# Patient Record
Sex: Female | Born: 1975 | Race: White | Hispanic: No | Marital: Married | Smoking: Never smoker
Health system: Southern US, Community
[De-identification: ages and names within clinical notes are randomized; demographics above are authoritative.]

## PROBLEM LIST (undated history)

## (undated) HISTORY — PX: ANKLE SURGERY: SHX546

## (undated) HISTORY — PX: BREAST BIOPSY: SHX20

---

## 2006-09-14 ENCOUNTER — Encounter: Payer: Self-pay | Admitting: Obstetrics and Gynecology

## 2006-09-14 ENCOUNTER — Inpatient Hospital Stay (HOSPITAL_COMMUNITY): Admission: AD | Admit: 2006-09-14 | Discharge: 2006-09-17 | Payer: Self-pay | Admitting: Obstetrics and Gynecology

## 2010-07-18 NOTE — H&P (Signed)
[name], [name]                  ACCOUNT NO.:  [id number]   MEDICAL RECORD NO.:  [id number]          PATIENT TYPE:  INP   LOCATION:  9108                          FACILITY:  [facility]   PHYSICIAN:  Tilda Burrow, M.D. DATE OF BIRTH:  15-Jan-1976   DATE OF ADMISSION:  09/14/2006  DATE OF DISCHARGE:                              HISTORY & PHYSICAL   ADMISSION DIAGNOSES:  1. Pregnancy at 41-2/7 weeks.  2. Latent phase of labor.  3. Nonreassuring fetal status.   HISTORY OF PRESENT ILLNESS:  This 35 year old female, G1, P0, AB0, EDC  September 05, 2006, at 41-2/7 weeks' gestation is admitted early on the  afternoon of September 14, 2006, for labor management with cervix 1-2 cm  initially assessed in MAU for several hours and then admitted for labor  management.  She has now progressed to 3-4 cm dilated, but membrane  rupture has revealed meconium discolored amniotic fluid and she is  having intermittent, irregular, severe variable decelerations with some  of the contractions despite the intensity of the contractions being  relatively mild.  Consideration was given to continue labor management  to intentionally increase the labor, but even slight increase in labor  intensity results in nonreassuring variable deceleration, so the patient  will be taken for cesarean section.  Risks and benefits of procedure  have been reviewed.   PAST MEDICAL HISTORY:  Negative.   PAST SURGICAL HISTORY:  Ankle surgery.   ALLERGIES:  LEVAQUIN causes nausea and vomiting only.   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  Husband  is a diabetic.   PRENATAL LABORATORY DATA:  Blood type O positive.  Varicella antibodies  present.  Rubella immune.  Hemoglobin 14, hematocrit 41.  Hepatitis,  HIV, RPR, GC and chlamydia all negative.  HSV-2 negative.  Group B  Streptococcus negative.  Glucose tolerance test 78 mg/percent.   The patient's cervix has not softened hardly at all and she remained  firm at  41 weeks with posterior closed cervix.  She was scheduled for  induction on the evening of July 11, but presented early in the day.   PHYSICAL EXAMINATION:  VITAL SIGNS:  Height 5 feet 8 inches, weight 210.  GENERAL:  Alert, oriented and active female tolerating labor adequately,  although intensity is only mild.  Epidural is in place.  ABDOMEN:  Term size fetus with estimated fetal weight 6-7 pounds with  difficulty distinguishing the upper margin of the pubic ramus for fundal  height measurement.  PELVIC:  Cervix 3-4 cm, 50%, very firm cervical tissue texture, -2  station, no descent of vertex occurring with contractions.   IMPRESSION:  Pregnancy at 41-2/7 weeks with nonreassuring fetal status.   PLAN:  Primary cesarean section.  Risks and benefits were reviewed.      Tilda Burrow, M.D.  Electronically Signed     JVF/MEDQ  D:  09/15/2006  T:  09/16/2006  Job:  [id number]   cc:   [facility]

## 2010-07-18 NOTE — Op Note (Signed)
NAMELURENA, [name]                  ACCOUNT NO.:  [id number]   MEDICAL RECORD NO.:  [id number]          PATIENT TYPE:  INP   LOCATION:  9108                          FACILITY:  [facility]   PHYSICIAN:  Tilda Burrow, M.D. DATE OF BIRTH:  [birth year]   DATE OF PROCEDURE:  09/14/2006  DATE OF DISCHARGE:                               OPERATIVE REPORT   PREOPERATIVE DIAGNOSES:  1. Pregnancy at 41 weeks.  2. Nonreassuring fetal status.  3. Meconium discolored amniotic fluid.   POSTOPERATIVE DIAGNOSES:  1. Pregnancy at 41 weeks.  2. Nonreassuring fetal status.  3. Meconium discolored amniotic fluid.   OPERATION PERFORMED:  Primary low transverse cervical cesarean section.   SURGEON:  Tilda Burrow, M.D.   ASSISTANTMichele Mcalpine D. Okey Dupre, M.D.   ANESTHESIA:  Epidural.   COMPLICATIONS:  None.   FINDINGS:  Thick meconium, discolored amniotic fluid.  Female infant,  Apgars 1, 2, and 9 at one, five and 10 minutes.  Weight 6 pounds, 11  ounces (3040 g).   DESCRIPTION OF PROCEDURE:  The patient was taken to the operating room.  Epidural analgesia confirmed as being adequate and Pfannenstiel type  incision performed.  Bladder flap was developed easily and without  difficulty and transverse uterine incision performed.  Index finger  retraction transversely and upward resulting in good access to the fetal  vertex with the infant delivered in the incision and expelled using  fundal pressure.  Bulb suctioning was performed briefly prior to handing  the baby to Dr. Joana Reamer, who then proceeded with a thorough nasogastric  suctioning and whose notes indicate resuscitation process.  The baby  responded nicely to bag mask ventilation once the thorough suctioning  had been completed.   The patient then had antibiotic intravenously administered and  irrigation of the uterine cavity after the placenta was delivered.  Cord  blood gases were obtained with cord blood pH reported as 7.1.  PCO2 is  documented in the record elsewhere.  Patient then had the uterus closed  in single layer in running locking fashion.  The bladder flap closed  with 2-0 chromic.  The anterior peritoneum closed with 2-0 chromic after  irrigation of the abdomen thoroughly.  The rectus muscles pulled  together with 2-0 chromic as well.  The fascia was closed using 0-0  Vicryl.  The subcutaneous tissues were approximated with 3-0 interrupted  sutures of 2-0 plain, staple closure of skin completed the procedure.  The patient tolerated the procedure well and went to the recovery room  in stable condition.  The estimated blood loss was 500 mL.   Note:  The baby's cord was extremely long, wrapped around the baby's  body and neck x1, and was noted to be extremely tight spiral.  Cord  venous diameter was quite good but there was minimal Wharton's jelly.      Tilda Burrow, M.D.  Electronically Signed     JVF/MEDQ  D:  09/15/2006  T:  09/16/2006  Job:  [id number]

## 2010-07-18 NOTE — Discharge Summary (Signed)
[name], [name]                  ACCOUNT NO.:  [id number]   MEDICAL RECORD NO.:  [id number]          PATIENT TYPE:  INP   LOCATION:  9108                          FACILITY:  [facility]   PHYSICIAN:  Tilda Burrow, M.D. DATE OF BIRTH:  04-Sep-1975   DATE OF ADMISSION:  09/14/2006  DATE OF DISCHARGE:  09/17/2006                               DISCHARGE SUMMARY   ADMITTING DIAGNOSIS:  Pregnancy at 41 weeks 2 days, active labor.   HISTORY OF PRESENT ILLNESS:  This 35 year old female gravida 1, para 0;  LMP November 28, 2005; [facility] September 05, 2006; is admitted after presenting  to [facility] on September 14, 2006, at 2 p.m. with cervical dilation  to 2-3 cm after contracting irregularly through the day.  She is 41  weeks 2 days  and was actually scheduled for induction that evening.  She is admitted for labor management.   PRENATAL COURSE:  Was followed at [facility] through 13 prenatal visits  with blood type O positive, rubella immunity present, hemoglobin 14,  hematocrit 41.  Hepatitis, HIV, RPR, GC and chlamydia all negative.  Group B strep negative.  She is admitted for labor management.   HOSPITAL COURSE:  The patient was admitted, progressed slowly through  the day, began to have variable decelerations of significance at 3-4 cm  even though the labor intensity was quite weak.  Consideration was given  to augmenting her labor but it was determined that the baby would not  tolerate the labor with deep variables occurring with any of the  contractions that had more than 50 mmHg intensity.  She was therefore  taken to cesarean section for nonreassuring fetal status.  The baby had  rather thick meconium but blood gas was good.  The resuscitation was  very thorough.  Apgars were 1, 7 and 9.  The baby did excellently with  no adverse sequelae.  Postpartum care was straightforward with the  patient being ambulatory, having postpartum hemoglobin of 12, hematocrit  37, compared to admitting  hemoglobin of 15, hematocrit of 45.  She was  stable for discharge on July 15 with staples removed and followup  planned for 4 weeks.   ADDENDUM:  Future contraception:  IUD after 4 week postpartum check.      Tilda Burrow, M.D.  Electronically Signed     JVF/MEDQ  D:  09/17/2006  T:  09/17/2006  Job:  [id number]

## 2010-07-18 NOTE — H&P (Signed)
NAME:  [name], [name]                  ACCOUNT NO.:  [id number]   MEDICAL RECORD NO.:  [id number]          PATIENT TYPE:  INP   LOCATION:  9108                          [facility]  [facility]   PHYSICIAN:  Tilda Burrow, M.D. DATE OF BIRTH:  02-26-1976   DATE OF ADMISSION:  09/14/2006  DATE OF DISCHARGE:                              HISTORY & PHYSICAL   ADMISSION DIAGNOSIS:  Pregnancy, 41 weeks, 2 days, early labor.   HISTORY OF PRESENT ILLNESS:  This 35 year old primiparous female, LMP  November 28, 2005, placing [facility] September 05, 2006.  Was admitted after  presenting in labor on September 07, 2006 with cervical dilation _by Foley  Bulb___ cervix was closed, but contractions are occurring on a regular  basis.  She is admitted for labor management.  Prenatal course has been  followed through _prenatal course_ at [facility] with blood type  O+, antibody screen negative, RPR negative.  Rubella immunity present.  Hepatitis and HIV negative.  Group B strep similarly negative.  She is  admitted for labor management.  She was previously scheduled for  induction on the evening of the 12th and presented with active  contractions.   PAST MEDICAL HISTORY:  Benign.   PAST SURGICAL HISTORY:  Ankle.   ALLERGIES:  LEVAQUIN causes nausea and vomiting as a minor side effect.   SOCIAL HISTORY:  Married.  Works at the [employer] in [employer].  Husband is a diabetic.   PHYSICAL EXAMINATION:  GENERAL:  Height 5 feet 8.  Weight 200.  HEENT:  Pupils are round and reactive.  NECK:  Supple.  ABDOMEN:  A term-sized fetus.  Estimated fetal weight 7 pounds.  Cervix  fingertip dilation upon admission with contractions of a mild nature,  irregular still upon admission.   PLAN:  Admit under Zerita Boers and teaching service on the 12th.  Uncertain prognosis for vaginal delivery.      Tilda Burrow, M.D.  Electronically Signed     JVF/MEDQ  D:  09/17/2006  T:  09/17/2006  Job:  [id number]

## 2010-12-19 LAB — CBC
Hemoglobin: 15.2 — ABNORMAL HIGH
MCHC: 33.5
MCHC: 33.8
MCV: 90.2
Platelets: 159
Platelets: 166
RBC: 4.11
RBC: 4.22
RBC: 5.05
RDW: 14.4 — ABNORMAL HIGH
RDW: 14.6 — ABNORMAL HIGH
WBC: 16.6 — ABNORMAL HIGH
WBC: 16.9 — ABNORMAL HIGH

## 2012-01-14 ENCOUNTER — Other Ambulatory Visit: Payer: Self-pay

## 2015-03-21 ENCOUNTER — Emergency Department (INDEPENDENT_AMBULATORY_CARE_PROVIDER_SITE_OTHER): Payer: Federal, State, Local not specified - PPO

## 2015-03-21 ENCOUNTER — Emergency Department
Admission: EM | Admit: 2015-03-21 | Discharge: 2015-03-21 | Disposition: A | Discharge status: Home / Self Care | Payer: Federal, State, Local not specified - PPO | Source: Home / Self Care | Attending: Emergency Medicine | Admitting: Emergency Medicine

## 2015-03-21 ENCOUNTER — Encounter: Payer: Self-pay | Admitting: Emergency Medicine

## 2015-03-21 DIAGNOSIS — M25571 Pain in right ankle and joints of right foot: Secondary | ICD-10-CM | POA: Diagnosis not present

## 2015-03-21 DIAGNOSIS — S93401A Sprain of unspecified ligament of right ankle, initial encounter: Secondary | ICD-10-CM | POA: Diagnosis not present

## 2015-03-21 NOTE — ED Notes (Signed)
Pt states she missed a step and fell on her right ankle x2 days ago. States painful and swollen. No previous ankle injury.

## 2015-03-21 NOTE — ED Provider Notes (Signed)
CSN: [id number]     Arrival date & time 03/21/15  0935 History   First MD Initiated Contact with Patient 03/21/15 [id number]     Chief Complaint  Patient presents with  . Ankle Pain   (Consider location/radiation/quality/duration/timing/severity/associated sxs/prior Treatment) HPI Right lateral ankle pain and swelling since she missed a step and injured right ankle 2 days ago. Despite trying ice and rest, pain and swelling persists. Pain is 2 out of 10 at rest, increases to 8 out of 10 when trying to weight-bear. Also worse with inversion and dorsiflexion and extension. Denies numbness or tingling or weakness. Denies previous ankle injury. No foot pain. No fever or chills or nausea or vomiting or calf pain. History reviewed. No pertinent past medical history. Past Surgical History  Procedure Laterality Date  . Cesarean section     History reviewed. No pertinent family history. Social History  Substance Use Topics  . Smoking status: Never Smoker   . Smokeless tobacco: Never Used  . Alcohol Use: No   OB History    No data available     Review of Systems  All other systems reviewed and are negative.   Allergies  Review of patient's allergies indicates not on file.  Home Medications   Prior to Admission medications   Not on File   Meds Ordered and Administered this Visit  Medications - No data to display  BP 116/81 mmHg  Pulse 87  Temp(Src) 97.8 F (36.6 C) (Oral)  Wt 178 lb (80.74 kg)  SpO2 97%  LMP 03/17/2015 No data found.   Physical Exam  Constitutional: She is oriented to person, place, and time. She appears well-developed and well-nourished. No distress.  HENT:  Head: Normocephalic and atraumatic.  Eyes: Conjunctivae and EOM are normal. Pupils are equal, round, and reactive to light. No scleral icterus.  Neck: Normal range of motion.  Cardiovascular: Normal rate.   Pulmonary/Chest: Effort normal.  Abdominal: She exhibits no distension.  Musculoskeletal:     Right ankle: She exhibits decreased range of motion and swelling. She exhibits no ecchymosis, no laceration and normal pulse. Tenderness. Lateral malleolus tenderness found. No head of 5th metatarsal and no proximal fibula tenderness found. Achilles tendon normal.       Feet:  Neurovascular distally intact  Neurological: She is alert and oriented to person, place, and time.  Skin: Skin is warm. No rash noted.  Psychiatric: She has a normal mood and affect.  Nursing note and vitals reviewed.   ED Course  Procedures (including critical care time)  Labs Review Labs Reviewed - No data to display  Imaging Review Dg Ankle Complete Right  03/21/2015  CLINICAL DATA:  RIGHT lateral ankle pain and swelling after missing a step 2 days ago, lateral ankle injury, initial encounter EXAM: RIGHT ANKLE - COMPLETE 3+ VIEW COMPARISON:  None FINDINGS: Soft tissue swelling RIGHT ankle. Osseous mineralization normal for technique. Joint spaces preserved. No acute fracture, dislocation, or bone destruction. IMPRESSION: No acute osseous abnormalities. Electronically Signed   By: Ulyses SouthwardMark  Boles M.D.   On: 03/21/2015 11:01     Visual Acuity Review  Right Eye Distance:   Left Eye Distance:   Bilateral Distance:    Right Eye Near:   Left Eye Near:    Bilateral Near:         MDM   1. Right ankle sprain, initial encounter    Treatment options discussed, as well as risks, benefits, alternatives. Patient voiced understanding and agreement with the following  plans:  ASO ankle brace applied. Encourage rest, ice, compression with ACE bandage, and elevation of injured body part.  Ice 24 hours, then heat. Other advice given. She declined prescription pain med, she prefers to use OTC ibuprofen or naproxen Names and numbers of sports medicine physicians given. Follow-up with sports medicine if no better in 2 days or sooner when necessary. Gave her a note excusing from work today and tomorrow--she works  for the post [employer]   Lajean Manes, MD 03/21/15 2152

## 2015-04-04 ENCOUNTER — Encounter: Payer: Self-pay | Admitting: Obstetrics & Gynecology

## 2015-04-04 ENCOUNTER — Ambulatory Visit (INDEPENDENT_AMBULATORY_CARE_PROVIDER_SITE_OTHER): Payer: Federal, State, Local not specified - PPO | Admitting: Obstetrics & Gynecology

## 2015-04-04 VITALS — BP 123/74 | HR 92 | Ht 68.0 in | Wt 183.0 lb

## 2015-04-04 DIAGNOSIS — Z1151 Encounter for screening for human papillomavirus (HPV): Secondary | ICD-10-CM

## 2015-04-04 DIAGNOSIS — Z124 Encounter for screening for malignant neoplasm of cervix: Secondary | ICD-10-CM

## 2015-04-04 DIAGNOSIS — Z01419 Encounter for gynecological examination (general) (routine) without abnormal findings: Secondary | ICD-10-CM

## 2015-04-04 DIAGNOSIS — Z1231 Encounter for screening mammogram for malignant neoplasm of breast: Secondary | ICD-10-CM | POA: Diagnosis not present

## 2015-04-04 NOTE — Patient Instructions (Signed)
Thank you for enrolling in [facility]. Please follow the instructions below to securely access your online medical record. MyChart allows you to send messages to your doctor, view your test results, manage appointments, and more.   How Do I Sign Up? 1. In your Internet browser, go to [employer] and enter [email or web address]. 2. Click on the Sign Up Now link in the Sign In box. You will see the New Member Sign Up page. 3. Enter your MyChart Access Code exactly as it appears below. You will not need to use this code after you've completed the sign-up process. If you do not sign up before the expiration date, you must request a new code.  MyChart Access Code: [email or web address] Expires: 05/27/2015 11:40 AM  4. Enter your Social Security Number ([id number]) and Date of Birth (mm/dd/yyyy) as indicated and click Submit. You will be taken to the next sign-up page. 5. Create a MyChart ID. This will be your MyChart login ID and cannot be changed, so think of one that is secure and easy to remember. 6. Create a MyChart password. You can change your password at any time. 7. Enter your Password Reset Question and Answer. This can be used at a later time if you forget your password.  8. Enter your e-mail address. You will receive e-mail notification when new information is available in [facility]. 9. Click Sign Up. You can now view your medical record.   Additional Information Remember, MyChart is NOT to be used for urgent needs. For medical emergencies, dial 911.  Preventive Care for Adults, Female A healthy lifestyle and preventive care can promote health and wellness. Preventive health guidelines for women include the following key practices.  A routine yearly physical is a good way to check with your health care provider about your health and preventive screening. It is a chance to share any concerns and updates on your health and to receive a thorough exam.  Visit your dentist for a routine  exam and preventive care every 6 months. Brush your teeth twice a day and floss once a day. Good oral hygiene prevents tooth decay and gum disease.  The frequency of eye exams is based on your age, health, family medical history, use of contact lenses, and other factors. Follow your health care provider's recommendations for frequency of eye exams.  Eat a healthy diet. Foods like vegetables, fruits, whole grains, low-fat dairy products, and lean protein foods contain the nutrients you need without too many calories. Decrease your intake of foods high in solid fats, added sugars, and salt. Eat the right amount of calories for you.Get information about a proper diet from your health care provider, if necessary.  Regular physical exercise is one of the most important things you can do for your health. Most adults should get at least 150 minutes of moderate-intensity exercise (any activity that increases your heart rate and causes you to sweat) each week. In addition, most adults need muscle-strengthening exercises on 2 or more days a week.  Maintain a healthy weight. The body mass index (BMI) is a screening tool to identify possible weight problems. It provides an estimate of body fat based on height and weight. Your health care provider can find your BMI and can help you achieve or maintain a healthy weight.For adults 20 years and older:  A BMI below 18.5 is considered underweight.  A BMI of 18.5 to 24.9 is normal.  A BMI of 25 to 29.9 is considered overweight.  A BMI of  30 and above is considered obese.  Maintain normal blood lipids and cholesterol levels by exercising and minimizing your intake of saturated fat. Eat a balanced diet with plenty of fruit and vegetables. Blood tests for lipids and cholesterol should begin at age 68 and be repeated every 5 years. If your lipid or cholesterol levels are high, you are over 50, or you are at high risk for heart disease, you may need your cholesterol  levels checked more frequently.Ongoing high lipid and cholesterol levels should be treated with medicines if diet and exercise are not working.  If you smoke, find out from your health care provider how to quit. If you do not use tobacco, do not start.  Lung cancer screening is recommended for adults aged 66-80 years who are at high risk for developing lung cancer because of a history of smoking. A yearly low-dose CT scan of the lungs is recommended for people who have at least a 30-pack-year history of smoking and are a current smoker or have quit within the past 15 years. A pack year of smoking is smoking an average of 1 pack of cigarettes a day for 1 year (for example: 1 pack a day for 30 years or 2 packs a day for 15 years). Yearly screening should continue until the smoker has stopped smoking for at least 15 years. Yearly screening should be stopped for people who develop a health problem that would prevent them from having lung cancer treatment.  If you are pregnant, do not drink alcohol. If you are breastfeeding, be very cautious about drinking alcohol. If you are not pregnant and choose to drink alcohol, do not have more than 1 drink per day. One drink is considered to be 12 ounces (355 mL) of beer, 5 ounces (148 mL) of wine, or 1.5 ounces (44 mL) of liquor.  Avoid use of street drugs. Do not share needles with anyone. Ask for help if you need support or instructions about stopping the use of drugs.  High blood pressure causes heart disease and increases the risk of stroke. Your blood pressure should be checked at least every 1 to 2 years. Ongoing high blood pressure should be treated with medicines if weight loss and exercise do not work.  If you are 86-71 years old, ask your health care provider if you should take aspirin to prevent strokes.  Diabetes screening is done by taking a blood sample to check your blood glucose level after you have not eaten for a certain period of time (fasting).  If you are not overweight and you do not have risk factors for diabetes, you should be screened once every 3 years starting at age 49. If you are overweight or obese and you are 39-40 years of age, you should be screened for diabetes every year as part of your cardiovascular risk assessment.  Breast cancer screening is essential preventive care for women. You should practice "breast self-awareness." This means understanding the normal appearance and feel of your breasts and may include breast self-examination. Any changes detected, no matter how small, should be reported to a health care provider. Women in their 3s and 30s should have a clinical breast exam (CBE) by a health care provider as part of a regular health exam every 1 to 3 years. After age 47, women should have a CBE every year. Starting at age 65, women should consider having a mammogram (breast X-ray test) every year. Women who have a family history of breast cancer should  talk to their health care provider about genetic screening. Women at a high risk of breast cancer should talk to their health care providers about having an MRI and a mammogram every year.  Breast cancer gene (BRCA)-related cancer risk assessment is recommended for women who have family members with BRCA-related cancers. BRCA-related cancers include breast, ovarian, tubal, and peritoneal cancers. Having family members with these cancers may be associated with an increased risk for harmful changes (mutations) in the breast cancer genes BRCA1 and BRCA2. Results of the assessment will determine the need for genetic counseling and BRCA1 and BRCA2 testing.  Your health care provider may recommend that you be screened regularly for cancer of the pelvic organs (ovaries, uterus, and vagina). This screening involves a pelvic examination, including checking for microscopic changes to the surface of your cervix (Pap test). You may be encouraged to have this screening done every 3 years,  beginning at age 50.  For women ages 62-65, health care providers may recommend pelvic exams and Pap testing every 3 years, or they may recommend the Pap and pelvic exam, combined with testing for human papilloma virus (HPV), every 5 years. Some types of HPV increase your risk of cervical cancer. Testing for HPV may also be done on women of any age with unclear Pap test results.  Other health care providers may not recommend any screening for nonpregnant women who are considered low risk for pelvic cancer and who do not have symptoms. Ask your health care provider if a screening pelvic exam is right for you.  If you have had past treatment for cervical cancer or a condition that could lead to cancer, you need Pap tests and screening for cancer for at least 20 years after your treatment. If Pap tests have been discontinued, your risk factors (such as having a new sexual partner) need to be reassessed to determine if screening should resume. Some women have medical problems that increase the chance of getting cervical cancer. In these cases, your health care provider may recommend more frequent screening and Pap tests.  Colorectal cancer can be detected and often prevented. Most routine colorectal cancer screening begins at the age of 12 years and continues through age 70 years. However, your health care provider may recommend screening at an earlier age if you have risk factors for colon cancer. On a yearly basis, your health care provider may provide home test kits to check for hidden blood in the stool. Use of a small camera at the end of a tube, to directly examine the colon (sigmoidoscopy or colonoscopy), can detect the earliest forms of colorectal cancer. Talk to your health care provider about this at age 45, when routine screening begins. Direct exam of the colon should be repeated every 5-10 years through age 6 years, unless early forms of precancerous polyps or small growths are found.  People  who are at an increased risk for hepatitis B should be screened for this virus. You are considered at high risk for hepatitis B if:  You were born in a country where hepatitis B occurs often. Talk with your health care provider about which countries are considered high risk.  Your parents were born in a high-risk country and you have not received a shot to protect against hepatitis B (hepatitis B vaccine).  You have HIV or AIDS.  You use needles to inject street drugs.  You live with, or have sex with, someone who has hepatitis B.  You get hemodialysis treatment.  You take certain medicines for conditions like cancer, organ transplantation, and autoimmune conditions.  Hepatitis C blood testing is recommended for all people born from 1945 through 1965 and any individual with known risks for hepatitis C.  Practice safe sex. Use condoms and avoid high-risk sexual practices to reduce the spread of sexually transmitted infections (STIs). STIs include gonorrhea, chlamydia, syphilis, trichomonas, herpes, HPV, and human immunodeficiency virus (HIV). Herpes, HIV, and HPV are viral illnesses that have no cure. They can result in disability, cancer, and death.  You should be screened for sexually transmitted illnesses (STIs) including gonorrhea and chlamydia if:  You are sexually active and are younger than 24 years.  You are older than 24 years and your health care provider tells you that you are at risk for this type of infection.  Your sexual activity has changed since you were last screened and you are at an increased risk for chlamydia or gonorrhea. Ask your health care provider if you are at risk.  If you are at risk of being infected with HIV, it is recommended that you take a prescription medicine daily to prevent HIV infection. This is called preexposure prophylaxis (PrEP). You are considered at risk if:  You are sexually active and do not regularly use condoms or know the HIV status of  your partner(s).  You take drugs by injection.  You are sexually active with a partner who has HIV.  Talk with your health care provider about whether you are at high risk of being infected with HIV. If you choose to begin PrEP, you should first be tested for HIV. You should then be tested every 3 months for as long as you are taking PrEP.  Osteoporosis is a disease in which the bones lose minerals and strength with aging. This can result in serious bone fractures or breaks. The risk of osteoporosis can be identified using a bone density scan. Women ages 65 years and over and women at risk for fractures or osteoporosis should discuss screening with their health care providers. Ask your health care provider whether you should take a calcium supplement or vitamin D to reduce the rate of osteoporosis.  Menopause can be associated with physical symptoms and risks. Hormone replacement therapy is available to decrease symptoms and risks. You should talk to your health care provider about whether hormone replacement therapy is right for you.  Use sunscreen. Apply sunscreen liberally and repeatedly throughout the day. You should seek shade when your shadow is shorter than you. Protect yourself by wearing long sleeves, pants, a wide-brimmed hat, and sunglasses year round, whenever you are outdoors.  Once a month, do a whole body skin exam, using a mirror to look at the skin on your back. Tell your health care provider of new moles, moles that have irregular borders, moles that are larger than a pencil eraser, or moles that have changed in shape or color.  Stay current with required vaccines (immunizations).  Influenza vaccine. All adults should be immunized every year.  Tetanus, diphtheria, and acellular pertussis (Td, Tdap) vaccine. Pregnant women should receive 1 dose of Tdap vaccine during each pregnancy. The dose should be obtained regardless of the length of time since the last dose. Immunization is  preferred during the 27th-36th week of gestation. An adult who has not previously received Tdap or who does not know her vaccine status should receive 1 dose of Tdap. This initial dose should be followed by tetanus and diphtheria toxoids (Td) booster doses every   10 years. Adults with an unknown or incomplete history of completing a 3-dose immunization series with Td-containing vaccines should begin or complete a primary immunization series including a Tdap dose. Adults should receive a Td booster every 10 years.  Varicella vaccine. An adult without evidence of immunity to varicella should receive 2 doses or a second dose if she has previously received 1 dose. Pregnant females who do not have evidence of immunity should receive the first dose after pregnancy. This first dose should be obtained before leaving the health care facility. The second dose should be obtained 4-8 weeks after the first dose.  Human papillomavirus (HPV) vaccine. Females aged 13-26 years who have not received the vaccine previously should obtain the 3-dose series. The vaccine is not recommended for use in pregnant females. However, pregnancy testing is not needed before receiving a dose. If a female is found to be pregnant after receiving a dose, no treatment is needed. In that case, the remaining doses should be delayed until after the pregnancy. Immunization is recommended for any person with an immunocompromised condition through the age of 26 years if she did not get any or all doses earlier. During the 3-dose series, the second dose should be obtained 4-8 weeks after the first dose. The third dose should be obtained 24 weeks after the first dose and 16 weeks after the second dose.  Zoster vaccine. One dose is recommended for adults aged 60 years or older unless certain conditions are present.  Measles, mumps, and rubella (MMR) vaccine. Adults born before 1957 generally are considered immune to measles and mumps. Adults born in 1957  or later should have 1 or more doses of MMR vaccine unless there is a contraindication to the vaccine or there is laboratory evidence of immunity to each of the three diseases. A routine second dose of MMR vaccine should be obtained at least 28 days after the first dose for students attending postsecondary schools, health care workers, or international travelers. People who received inactivated measles vaccine or an unknown type of measles vaccine during 1963-1967 should receive 2 doses of MMR vaccine. People who received inactivated mumps vaccine or an unknown type of mumps vaccine before 1979 and are at high risk for mumps infection should consider immunization with 2 doses of MMR vaccine. For females of childbearing age, rubella immunity should be determined. If there is no evidence of immunity, females who are not pregnant should be vaccinated. If there is no evidence of immunity, females who are pregnant should delay immunization until after pregnancy. Unvaccinated health care workers born before 1957 who lack laboratory evidence of measles, mumps, or rubella immunity or laboratory confirmation of disease should consider measles and mumps immunization with 2 doses of MMR vaccine or rubella immunization with 1 dose of MMR vaccine.  Pneumococcal 13-valent conjugate (PCV13) vaccine. When indicated, a person who is uncertain of his immunization history and has no record of immunization should receive the PCV13 vaccine. All adults 65 years of age and older should receive this vaccine. An adult aged 19 years or older who has certain medical conditions and has not been previously immunized should receive 1 dose of PCV13 vaccine. This PCV13 should be followed with a dose of pneumococcal polysaccharide (PPSV23) vaccine. Adults who are at high risk for pneumococcal disease should obtain the PPSV23 vaccine at least 8 weeks after the dose of PCV13 vaccine. Adults older than 41 years of age who have normal immune system  function should obtain the PPSV23 vaccine   dose at least 1 year after the dose of PCV13 vaccine.  Pneumococcal polysaccharide (PPSV23) vaccine. When PCV13 is also indicated, PCV13 should be obtained first. All adults aged 65 years and older should be immunized. An adult younger than age 65 years who has certain medical conditions should be immunized. Any person who resides in a nursing home or long-term care facility should be immunized. An adult smoker should be immunized. People with an immunocompromised condition and certain other conditions should receive both PCV13 and PPSV23 vaccines. People with human immunodeficiency virus (HIV) infection should be immunized as soon as possible after diagnosis. Immunization during chemotherapy or radiation therapy should be avoided. Routine use of PPSV23 vaccine is not recommended for American Indians, [place] Natives, or people younger than 65 years unless there are medical conditions that require PPSV23 vaccine. When indicated, people who have unknown immunization and have no record of immunization should receive PPSV23 vaccine. One-time revaccination 5 years after the first dose of PPSV23 is recommended for people aged 19-64 years who have chronic kidney failure, nephrotic syndrome, asplenia, or immunocompromised conditions. People who received 1-2 doses of PPSV23 before age 65 years should receive another dose of PPSV23 vaccine at age 65 years or later if at least 5 years have passed since the previous dose. Doses of PPSV23 are not needed for people immunized with PPSV23 at or after age 65 years.  Meningococcal vaccine. Adults with asplenia or persistent complement component deficiencies should receive 2 doses of quadrivalent meningococcal conjugate (MenACWY-D) vaccine. The doses should be obtained at least 2 months apart. Microbiologists working with certain meningococcal bacteria, military recruits, people at risk during an outbreak, and people who travel to or live  in countries with a high rate of meningitis should be immunized. A first-year college student up through age 21 years who is living in a residence hall should receive a dose if she did not receive a dose on or after her 16th birthday. Adults who have certain high-risk conditions should receive one or more doses of vaccine.  Hepatitis A vaccine. Adults who wish to be protected from this disease, have certain high-risk conditions, work with hepatitis A-infected animals, work in hepatitis A research labs, or travel to or work in countries with a high rate of hepatitis A should be immunized. Adults who were previously unvaccinated and who anticipate close contact with an international adoptee during the first 60 days after arrival in the [place] from a country with a high rate of hepatitis A should be immunized.  Hepatitis B vaccine. Adults who wish to be protected from this disease, have certain high-risk conditions, may be exposed to blood or other infectious body fluids, are household contacts or sex partners of hepatitis B positive people, are clients or workers in certain care facilities, or travel to or work in countries with a high rate of hepatitis B should be immunized.  Haemophilus influenzae type b (Hib) vaccine. A previously unvaccinated person with asplenia or sickle cell disease or having a scheduled splenectomy should receive 1 dose of Hib vaccine. Regardless of previous immunization, a recipient of a hematopoietic stem cell transplant should receive a 3-dose series 6-12 months after her successful transplant. Hib vaccine is not recommended for adults with HIV infection. Preventive Services / Frequency Ages 19 to 39 years  Blood pressure check.** / Every 3-5 years.  Lipid and cholesterol check.** / Every 5 years beginning at age 20.  Clinical breast exam.** / Every 3 years for women in their 20s   and 30s.  BRCA-related cancer risk assessment.** / For women who have family members with  a BRCA-related cancer (breast, ovarian, tubal, or peritoneal cancers).  Pap test.** / Every 2 years from ages 21 through 29. Every 3 years starting at age 30 through age 65 or 70 with a history of 3 consecutive normal Pap tests.  HPV screening.** / Every 3 years from ages 30 through ages 65 to 70 with a history of 3 consecutive normal Pap tests.  Hepatitis C blood test.** / For any individual with known risks for hepatitis C.  Skin self-exam. / Monthly.  Influenza vaccine. / Every year.  Tetanus, diphtheria, and acellular pertussis (Tdap, Td) vaccine.** / Consult your health care provider. Pregnant women should receive 1 dose of Tdap vaccine during each pregnancy. 1 dose of Td every 10 years.  Varicella vaccine.** / Consult your health care provider. Pregnant females who do not have evidence of immunity should receive the first dose after pregnancy.  HPV vaccine. / 3 doses over 6 months, if 26 and younger. The vaccine is not recommended for use in pregnant females. However, pregnancy testing is not needed before receiving a dose.  Measles, mumps, rubella (MMR) vaccine.** / You need at least 1 dose of MMR if you were born in 1957 or later. You may also need a 2nd dose. For females of childbearing age, rubella immunity should be determined. If there is no evidence of immunity, females who are not pregnant should be vaccinated. If there is no evidence of immunity, females who are pregnant should delay immunization until after pregnancy.  Pneumococcal 13-valent conjugate (PCV13) vaccine.** / Consult your health care provider.  Pneumococcal polysaccharide (PPSV23) vaccine.** / 1 to 2 doses if you smoke cigarettes or if you have certain conditions.  Meningococcal vaccine.** / 1 dose if you are age 19 to 21 years and a first-year college student living in a residence hall, or have one of several medical conditions, you need to get vaccinated against meningococcal disease. You may also need  additional booster doses.  Hepatitis A vaccine.** / Consult your health care provider.  Hepatitis B vaccine.** / Consult your health care provider.  Haemophilus influenzae type b (Hib) vaccine.** / Consult your health care provider. Ages 40 to 64 years  Blood pressure check.** / Every year.  Lipid and cholesterol check.** / Every 5 years beginning at age 20 years.  Lung cancer screening. / Every year if you are aged 55-80 years and have a 30-pack-year history of smoking and currently smoke or have quit within the past 15 years. Yearly screening is stopped once you have quit smoking for at least 15 years or develop a health problem that would prevent you from having lung cancer treatment.  Clinical breast exam.** / Every year after age 40 years.  BRCA-related cancer risk assessment.** / For women who have family members with a BRCA-related cancer (breast, ovarian, tubal, or peritoneal cancers).  Mammogram.** / Every year beginning at age 40 years and continuing for as long as you are in good health. Consult with your health care provider.  Pap test.** / Every 3 years starting at age 30 years through age 65 or 70 years with a history of 3 consecutive normal Pap tests.  HPV screening.** / Every 3 years from ages 30 years through ages 65 to 70 years with a history of 3 consecutive normal Pap tests.  Fecal occult blood test (FOBT) of stool. / Every year beginning at age 50 years and continuing   until age 75 years. You may not need to do this test if you get a colonoscopy every 10 years.  Flexible sigmoidoscopy or colonoscopy.** / Every 5 years for a flexible sigmoidoscopy or every 10 years for a colonoscopy beginning at age 50 years and continuing until age 75 years.  Hepatitis C blood test.** / For all people born from 1945 through 1965 and any individual with known risks for hepatitis C.  Skin self-exam. / Monthly.  Influenza vaccine. / Every year.  Tetanus, diphtheria, and acellular  pertussis (Tdap/Td) vaccine.** / Consult your health care provider. Pregnant women should receive 1 dose of Tdap vaccine during each pregnancy. 1 dose of Td every 10 years.  Varicella vaccine.** / Consult your health care provider. Pregnant females who do not have evidence of immunity should receive the first dose after pregnancy.  Zoster vaccine.** / 1 dose for adults aged 60 years or older.  Measles, mumps, rubella (MMR) vaccine.** / You need at least 1 dose of MMR if you were born in 1957 or later. You may also need a second dose. For females of childbearing age, rubella immunity should be determined. If there is no evidence of immunity, females who are not pregnant should be vaccinated. If there is no evidence of immunity, females who are pregnant should delay immunization until after pregnancy.  Pneumococcal 13-valent conjugate (PCV13) vaccine.** / Consult your health care provider.  Pneumococcal polysaccharide (PPSV23) vaccine.** / 1 to 2 doses if you smoke cigarettes or if you have certain conditions.  Meningococcal vaccine.** / Consult your health care provider.  Hepatitis A vaccine.** / Consult your health care provider.  Hepatitis B vaccine.** / Consult your health care provider.  Haemophilus influenzae type b (Hib) vaccine.** / Consult your health care provider. Ages 65 years and over  Blood pressure check.** / Every year.  Lipid and cholesterol check.** / Every 5 years beginning at age 20 years.  Lung cancer screening. / Every year if you are aged 55-80 years and have a 30-pack-year history of smoking and currently smoke or have quit within the past 15 years. Yearly screening is stopped once you have quit smoking for at least 15 years or develop a health problem that would prevent you from having lung cancer treatment.  Clinical breast exam.** / Every year after age 40 years.  BRCA-related cancer risk assessment.** / For women who have family members with a BRCA-related  cancer (breast, ovarian, tubal, or peritoneal cancers).  Mammogram.** / Every year beginning at age 40 years and continuing for as long as you are in good health. Consult with your health care provider.  Pap test.** / Every 3 years starting at age 30 years through age 65 or 70 years with 3 consecutive normal Pap tests. Testing can be stopped between 65 and 70 years with 3 consecutive normal Pap tests and no abnormal Pap or HPV tests in the past 10 years.  HPV screening.** / Every 3 years from ages 30 years through ages 65 or 70 years with a history of 3 consecutive normal Pap tests. Testing can be stopped between 65 and 70 years with 3 consecutive normal Pap tests and no abnormal Pap or HPV tests in the past 10 years.  Fecal occult blood test (FOBT) of stool. / Every year beginning at age 50 years and continuing until age 75 years. You may not need to do this test if you get a colonoscopy every 10 years.  Flexible sigmoidoscopy or colonoscopy.** / Every 5 years   for a flexible sigmoidoscopy or every 10 years for a colonoscopy beginning at age 61 years and continuing until age 94 years.  Hepatitis C blood test.** / For all people born from [birth year] through 1965 and any individual with known risks for hepatitis C.  Osteoporosis screening.** / A one-time screening for women ages 22 years and over and women at risk for fractures or osteoporosis.  Skin self-exam. / Monthly.  Influenza vaccine. / Every year.  Tetanus, diphtheria, and acellular pertussis (Tdap/Td) vaccine.** / 1 dose of Td every 10 years.  Varicella vaccine.** / Consult your health care provider.  Zoster vaccine.** / 1 dose for adults aged 66 years or older.  Pneumococcal 13-valent conjugate (PCV13) vaccine.** / Consult your health care provider.  Pneumococcal polysaccharide (PPSV23) vaccine.** / 1 dose for all adults aged 14 years and older.  Meningococcal vaccine.** / Consult your health care provider.  Hepatitis A vaccine.** /  Consult your health care provider.  Hepatitis B vaccine.** / Consult your health care provider.  Haemophilus influenzae type b (Hib) vaccine.** / Consult your health care provider. ** Family history and personal history of risk and conditions may change your health care provider's recommendations.   This information is not intended to replace advice given to you by your health care provider. Make sure you discuss any questions you have with your health care provider.   Document Released: 04/17/2001 Document Revised: 03/12/2014 Document Reviewed: 07/17/2010 Elsevier Interactive Patient Education [employer].

## 2015-04-04 NOTE — Progress Notes (Signed)
GYNECOLOGY CLINIC ANNUAL PREVENTATIVE CARE ENCOUNTER NOTE  Subjective:   [name] is a 40 y.o. [id number] female here for a routine annual gynecologic exam and to establish care with our practice. Has been a patient of Dr. Ilda Mori of [facility]; but moved to the area recently.  Current complaints: none.   Denies abnormal vaginal bleeding, discharge, pelvic pain, problems with intercourse or other gynecologic concerns.    Gynecologic History Patient's last menstrual period was 03/17/2015. Contraception: none. Not planning to be pregnant, but will not mind it.  Only child is 49 years old. Last Pap: 2015. Results were: normal. No history of cervical dysplasia Never had a mammogram.  Obstetric History OB History  Gravida Para Term Preterm AB SAB TAB Ectopic Multiple Living  0 0 0 0 0 0 1    # Outcome Date GA Lbr Len/2nd Weight Sex Delivery Anes PTL Lv  1 Term 09/14/06    F CS-Unspec   Y      History reviewed. No pertinent past medical history.  Past Surgical History  Procedure Laterality Date  . Cesarean section    . Ankle surgery      No current outpatient prescriptions on file prior to visit.   No current facility-administered medications on file prior to visit.    No Known Allergies  Social History   Social History  . Marital Status: Married    Spouse Name: N/A  . Number of Children: N/A  . Years of Education: N/A   Occupational History  . Not on file.   Social History Main Topics  . Smoking status: Never Smoker   . Smokeless tobacco: Never Used  . Alcohol Use: No  . Drug Use: No  . Sexual Activity:    Partners: Male    Birth Control/ Protection: None   Other Topics Concern  . Not on file   Social History Narrative    History reviewed. No pertinent family history.  The following portions of the patient's history were reviewed and updated as appropriate: allergies, current medications, past family history, past medical history,  past social history, past surgical history and problem list.  Review of Systems Pertinent items noted in HPI and remainder of comprehensive ROS otherwise negative.   Objective:  BP 123/74 mmHg  Pulse 92  Ht  (1.727 m)  Wt 183 lb (83.008 kg)  BMI 27.83 kg/m2  LMP 03/17/2015 CONSTITUTIONAL: Well-developed, well-nourished female in no acute distress.  HENT:  Normocephalic, atraumatic, External right and left ear normal. Oropharynx is clear and moist EYES: Conjunctivae and EOM are normal. Pupils are equal, round, and reactive to light. No scleral icterus.  NECK: Normal range of motion, supple, no masses.  Normal thyroid.  SKIN: Skin is warm and dry. No rash noted. Not diaphoretic. No erythema. No pallor. NEUROLGIC: Alert and oriented to person, place, and time. Normal reflexes, muscle tone coordination. No cranial nerve deficit noted. PSYCHIATRIC: Normal mood and affect. Normal behavior. Normal judgment and thought content. CARDIOVASCULAR: Normal heart rate noted, regular rhythm RESPIRATORY: Clear to auscultation bilaterally. Effort and breath sounds normal, no problems with respiration noted. BREASTS: Symmetric in size. No masses, skin changes, nipple drainage, or lymphadenopathy. ABDOMEN: Soft, normal bowel sounds, no distention noted.  No tenderness, rebound or guarding.  PELVIC: Normal appearing external genitalia; normal appearing vaginal mucosa and cervix.  No abnormal discharge noted.  Pap smear obtained.  Normal uterine size, no other palpable masses, no uterine or  adnexal tenderness. MUSCULOSKELETAL: Normal range of motion. No tenderness.  No cyanosis, clubbing, or edema.  2+ distal pulses.   Assessment:  Annual gynecologic examination with pap smear   Plan:  Will follow up results of pap smear and manage accordingly. Mammogram scheduled Routine preventative health maintenance measures emphasized. Please refer to After Visit Summary for other counseling recommendations.     Jaynie Collins, MD, FACOG Attending Obstetrician & Gynecologist, [facility]

## 2015-04-06 LAB — CYTOLOGY - PAP

## 2015-04-20 ENCOUNTER — Ambulatory Visit (INDEPENDENT_AMBULATORY_CARE_PROVIDER_SITE_OTHER): Payer: Federal, State, Local not specified - PPO

## 2015-04-20 ENCOUNTER — Ambulatory Visit: Payer: Federal, State, Local not specified - PPO

## 2015-04-20 DIAGNOSIS — R928 Other abnormal and inconclusive findings on diagnostic imaging of breast: Secondary | ICD-10-CM | POA: Diagnosis not present

## 2015-04-20 DIAGNOSIS — Z1231 Encounter for screening mammogram for malignant neoplasm of breast: Secondary | ICD-10-CM

## 2015-04-26 ENCOUNTER — Other Ambulatory Visit: Payer: Self-pay | Admitting: Obstetrics & Gynecology

## 2015-04-26 DIAGNOSIS — R928 Other abnormal and inconclusive findings on diagnostic imaging of breast: Secondary | ICD-10-CM

## 2015-05-13 ENCOUNTER — Ambulatory Visit
Admission: RE | Admit: 2015-05-13 | Discharge: 2015-05-13 | Disposition: A | Discharge status: Home / Self Care | Payer: Federal, State, Local not specified - PPO | Source: Ambulatory Visit | Attending: Obstetrics & Gynecology | Admitting: Obstetrics & Gynecology

## 2015-05-13 DIAGNOSIS — R928 Other abnormal and inconclusive findings on diagnostic imaging of breast: Secondary | ICD-10-CM

## 2015-10-11 ENCOUNTER — Other Ambulatory Visit: Payer: Self-pay | Admitting: Obstetrics & Gynecology

## 2015-10-11 DIAGNOSIS — N6310 Unspecified lump in the right breast, unspecified quadrant: Secondary | ICD-10-CM

## 2015-11-21 ENCOUNTER — Ambulatory Visit
Admission: RE | Admit: 2015-11-21 | Discharge: 2015-11-21 | Disposition: A | Discharge status: Home / Self Care | Payer: Federal, State, Local not specified - PPO | Source: Ambulatory Visit | Attending: Obstetrics & Gynecology | Admitting: Obstetrics & Gynecology

## 2015-11-21 DIAGNOSIS — N6310 Unspecified lump in the right breast, unspecified quadrant: Secondary | ICD-10-CM

## 2015-12-19 DIAGNOSIS — K080 Exfoliation of teeth due to systemic causes: Secondary | ICD-10-CM | POA: Diagnosis not present

## 2016-03-14 ENCOUNTER — Encounter: Payer: Self-pay | Admitting: *Deleted

## 2016-03-14 ENCOUNTER — Emergency Department
Admission: EM | Admit: 2016-03-14 | Discharge: 2016-03-14 | Disposition: A | Discharge status: Home / Self Care | Payer: Federal, State, Local not specified - PPO | Source: Home / Self Care | Attending: Family Medicine | Admitting: Family Medicine

## 2016-03-14 DIAGNOSIS — R69 Illness, unspecified: Secondary | ICD-10-CM | POA: Diagnosis not present

## 2016-03-14 DIAGNOSIS — J111 Influenza due to unidentified influenza virus with other respiratory manifestations: Secondary | ICD-10-CM

## 2016-03-14 MED ORDER — OSELTAMIVIR PHOSPHATE 75 MG PO CAPS: 75.0000 mg | ORAL_CAPSULE | Freq: Two times a day (BID) | ORAL | 0 refills | Status: DC

## 2016-03-14 NOTE — ED Triage Notes (Signed)
Pt c/o body aches, hot/cold, and nonproductive cough x 4 days. Last Advil today at 1430.

## 2016-03-14 NOTE — Discharge Instructions (Signed)
Take plain guaifenesin (1200mg extended release tabs such as Mucinex) twice daily, with plenty of water, for cough and congestion.  May add Pseudoephedrine (30mg, one or two every 4 to 6 hours) for sinus congestion.  Get adequate rest.   °May use Afrin nasal spray (or generic oxymetazoline) twice daily for about 5 days and then discontinue.  Also recommend using saline nasal spray several times daily and saline nasal irrigation (AYR is a common brand).  Use Flonase nasal spray each morning after using Afrin nasal spray and saline nasal irrigation. °Try warm salt water gargles for sore throat.  °Stop all antihistamines for now, and other non-prescription cough/cold preparations. °May take Ibuprofen 200mg, 4 tabs every 8 hours with food for body aches, headache, etc. °May take Delsym Cough Suppressant at bedtime for nighttime cough.  °  °

## 2016-03-14 NOTE — ED Provider Notes (Signed)
[facility]    CSN: [id number] Arrival date & time: 03/14/16  1635     History   Chief Complaint Chief Complaint  Patient presents with  . Cough    HPI [name] is a 41 y.o. female.   Complains of onset of fatigue four days ago.  Then she developed 2 day history of flu-like illness including myalgias, headache, chills/sweats, and cough.  Also has mild nasal congestion and sore throat.  Cough is non-productive and somewhat worse at night.  No pleuritic pain or shortness of breath.     The history is provided by the patient.    History reviewed. No pertinent past medical history.  There are no active problems to display for this patient.   Past Surgical History:  Procedure Laterality Date  . ANKLE SURGERY    . CESAREAN SECTION      OB History    Gravida Para Term Preterm AB Living   1 1 1  0 0 1   SAB TAB Ectopic Multiple Live Births   0 0 0 0 1       Home Medications    Prior to Admission medications   Medication Sig Start Date End Date Taking? Authorizing Provider  oseltamivir (TAMIFLU) 75 MG capsule Take 1 capsule (75 mg total) by mouth every 12 (twelve) hours. 03/14/16   Lattie HawStephen A Beese, MD    Family History History reviewed. No pertinent family history.  Social History Social History  Substance Use Topics  . Smoking status: Never Smoker  . Smokeless tobacco: Never Used  . Alcohol use No     Allergies   Patient has no known allergies.   Review of Systems Review of Systems + sore throat + cough No pleuritic pain No wheezing + nasal congestion + post-nasal drainage No sinus pain/pressure No itchy/red eyes ? earache No hemoptysis No SOB No fever, + chills/sweats No nausea No vomiting No abdominal pain No diarrhea No urinary symptoms No skin rash + fatigue + myalgias + headache Used OTC meds without relief   Physical Exam Triage Vital Signs ED Triage Vitals  Enc Vitals Group     BP 03/14/16 1729 126/80   Pulse Rate 03/14/16 1729 77     Resp 03/14/16 1729 18     Temp 03/14/16 1729 97.9 F (36.6 C)     Temp Source 03/14/16 1729 Oral     SpO2 03/14/16 1729 99 %     Weight 03/14/16 1729 185 lb (83.9 kg)     Height 03/14/16 1729 5\' 8"  (1.727 m)     Head Circumference --      Peak Flow --      Pain Score 03/14/16 1730 0     Pain Loc --      Pain Edu? --      Excl. in GC? --    No data found.   Updated Vital Signs BP 126/80 (BP Location: Left Arm)   Pulse 77   Temp 97.9 F (36.6 C) (Oral)   Resp 18   Ht 5\' 8"  (1.727 m)   Wt 185 lb (83.9 kg)   LMP 02/23/2016   SpO2 99%   BMI 28.13 kg/m   Visual Acuity Right Eye Distance:   Left Eye Distance:   Bilateral Distance:    Right Eye Near:   Left Eye Near:    Bilateral Near:     Physical Exam Nursing notes and Vital Signs reviewed. Appearance:  Patient appears stated age,  and in no acute distress Eyes:  Pupils are equal, round, and reactive to light and accomodation.  Extraocular movement is intact.  Conjunctivae are not inflamed  Ears:  Canals normal.  Tympanic membranes normal.  Nose:  Mildly congested turbinates.  No sinus tenderness.   Pharynx:  Normal Neck:  Supple.  Nontender enlarged posterior/lateral nodes are palpated bilaterally  Lungs:  Clear to auscultation.  Breath sounds are equal.  Moving air well. Heart:  Regular rate and rhythm without murmurs, rubs, or gallops.  Abdomen:  Nontender without masses or hepatosplenomegaly.  Bowel sounds are present.  No CVA or flank tenderness.  Extremities:  No edema.  Skin:  No rash present.    UC Treatments / Results  Labs (all labs ordered are listed, but only abnormal results are displayed) Labs Reviewed - No data to display  EKG  EKG Interpretation None       Radiology No results found.  Procedures Procedures (including critical care time)  Medications Ordered in UC Medications - No data to display   Initial Impression / Assessment and Plan / UC Course    I have reviewed the triage vital signs and the nursing notes.  Pertinent labs & imaging results that were available during my care of the patient were reviewed by me and considered in my medical decision making (see chart for details).  Clinical Course   Begin Tamiflu. Take plain guaifenesin (1200mg  extended release tabs such as Mucinex) twice daily, with plenty of water, for cough and congestion.  May add Pseudoephedrine (30mg , one or two every 4 to 6 hours) for sinus congestion.  Get adequate rest.   May use Afrin nasal spray (or generic oxymetazoline) twice daily for about 5 days and then discontinue.  Also recommend using saline nasal spray several times daily and saline nasal irrigation (AYR is a common brand).  Use Flonase nasal spray each morning after using Afrin nasal spray and saline nasal irrigation. Try warm salt water gargles for sore throat.  Stop all antihistamines for now, and other non-prescription cough/cold preparations. May take Ibuprofen 200mg , 4 tabs every 8 hours with food for body aches, headache, etc. May take Delsym Cough Suppressant at bedtime for nighttime cough.     Final Clinical Impressions(s) / UC Diagnoses   Final diagnoses:  Influenza-like illness    New Prescriptions New Prescriptions   OSELTAMIVIR (TAMIFLU) 75 MG CAPSULE    Take 1 capsule (75 mg total) by mouth every 12 (twelve) hours.     Lattie Haw, MD 03/27/16 [phone]

## 2016-03-27 ENCOUNTER — Other Ambulatory Visit: Payer: Self-pay | Admitting: Obstetrics & Gynecology

## 2016-03-27 DIAGNOSIS — N6310 Unspecified lump in the right breast, unspecified quadrant: Secondary | ICD-10-CM

## 2016-05-18 ENCOUNTER — Ambulatory Visit
Admission: RE | Admit: 2016-05-18 | Discharge: 2016-05-18 | Disposition: A | Discharge status: Home / Self Care | Payer: Federal, State, Local not specified - PPO | Source: Ambulatory Visit | Attending: Obstetrics & Gynecology | Admitting: Obstetrics & Gynecology

## 2016-05-18 DIAGNOSIS — N6310 Unspecified lump in the right breast, unspecified quadrant: Secondary | ICD-10-CM

## 2016-05-18 DIAGNOSIS — N6489 Other specified disorders of breast: Secondary | ICD-10-CM | POA: Diagnosis not present

## 2016-05-18 DIAGNOSIS — R928 Other abnormal and inconclusive findings on diagnostic imaging of breast: Secondary | ICD-10-CM | POA: Diagnosis not present

## 2016-08-13 DIAGNOSIS — K080 Exfoliation of teeth due to systemic causes: Secondary | ICD-10-CM | POA: Diagnosis not present

## 2017-03-25 DIAGNOSIS — K080 Exfoliation of teeth due to systemic causes: Secondary | ICD-10-CM | POA: Diagnosis not present

## 2017-04-10 ENCOUNTER — Other Ambulatory Visit: Payer: Self-pay

## 2017-04-10 ENCOUNTER — Encounter: Payer: Self-pay | Admitting: *Deleted

## 2017-04-10 ENCOUNTER — Emergency Department
Admission: EM | Admit: 2017-04-10 | Discharge: 2017-04-10 | Disposition: A | Discharge status: Home / Self Care | Payer: Federal, State, Local not specified - PPO | Source: Home / Self Care | Attending: Family Medicine | Admitting: Family Medicine

## 2017-04-10 DIAGNOSIS — J069 Acute upper respiratory infection, unspecified: Secondary | ICD-10-CM

## 2017-04-10 DIAGNOSIS — B9789 Other viral agents as the cause of diseases classified elsewhere: Secondary | ICD-10-CM

## 2017-04-10 MED ORDER — BENZONATATE 200 MG PO CAPS: ORAL_CAPSULE | ORAL | 0 refills | Status: DC

## 2017-04-10 MED ORDER — PREDNISONE 20 MG PO TABS: ORAL_TABLET | ORAL | 0 refills | Status: DC

## 2017-04-10 MED ORDER — AZITHROMYCIN 250 MG PO TABS: ORAL_TABLET | ORAL | 0 refills | Status: DC

## 2017-04-10 NOTE — ED Triage Notes (Signed)
Pt c/o sinus pain, bilateral ear pressure, HA, nonproductive cough, and temp up to 101 x 4 days. She has used Afrin without relief.

## 2017-04-10 NOTE — Discharge Instructions (Signed)
Take plain guaifenesin (1200mg extended release tabs such as Mucinex) twice daily, with plenty of water, for cough and congestion.  Get adequate rest.   °May use Afrin nasal spray (or generic oxymetazoline) each morning for about 5 days and then discontinue.  Also recommend using saline nasal spray several times daily and saline nasal irrigation (AYR is a common brand).  Use Flonase nasal spray each morning after using Afrin nasal spray and saline nasal irrigation. °Try warm salt water gargles for sore throat.  °Stop all antihistamines for now, and other non-prescription cough/cold preparations. °Begin Azithromycin if not improving about one week or if persistent fever develops   °  °

## 2017-04-10 NOTE — ED Provider Notes (Signed)
[facility]    CSN: [id number] Arrival date & time: 04/10/17  0932     History   Chief Complaint Chief Complaint  Patient presents with  . Cough  . Headache    HPI [name] is a 42 y.o. female.   Five days ago patient developed typical cold-like symptoms developing over several days, including mild sore throat, sinus congestion, headache, chills, and fatigue.  Her cough started yesterday.  She has a past history of sinus problems.   The history is provided by the patient.    History reviewed. No pertinent past medical history.  There are no active problems to display for this patient.   Past Surgical History:  Procedure Laterality Date  . ANKLE SURGERY    . CESAREAN SECTION      OB History    Gravida Para Term Preterm AB Living   1 1 1  0 0 1   SAB TAB Ectopic Multiple Live Births   0 0 0 0 1       Home Medications    Prior to Admission medications   Medication Sig Start Date End Date Taking? Authorizing Provider  azithromycin (ZITHROMAX Z-PAK) 250 MG tablet Take 2 tabs today; then begin one tab once daily for 4 more days. (Rx void after 04/18/17) 04/10/17   Lattie Haw, MD  benzonatate (TESSALON) 200 MG capsule Take one cap by mouth at bedtime as needed for cough.  May repeat in 4 to 6 hours 04/10/17   Lattie Haw, MD  predniSONE (DELTASONE) 20 MG tablet Take one tab by mouth twice daily for 4 days, then one daily for 3 days. Take with food. 04/10/17   Lattie Haw, MD    Family History History reviewed. No pertinent family history.  Social History Social History   Tobacco Use  . Smoking status: Never Smoker  . Smokeless tobacco: Never Used  Substance Use Topics  . Alcohol use: No    Alcohol/week: 0.0 oz  . Drug use: No     Allergies   Patient has no known allergies.   Review of Systems Review of Systems + sore throat + cough No pleuritic pain No wheezing + nasal congestion + post-nasal drainage + sinus  pain/pressure No itchy/red eyes No earache No hemoptysis No SOB No fever, + chills No nausea No vomiting No abdominal pain No diarrhea No urinary symptoms No skin rash + fatigue No myalgias + headache Used OTC meds without relief   Physical Exam Triage Vital Signs ED Triage Vitals [04/10/17 1005]  Enc Vitals Group     BP 122/78     Pulse Rate 83     Resp 16     Temp 98.2 F (36.8 C)     Temp Source Oral     SpO2 97 %     Weight 187 lb (84.8 kg)     Height 5\' 9"  (1.753 m)     Head Circumference      Peak Flow      Pain Score 0     Pain Loc      Pain Edu?      Excl. in GC?    No data found.  Updated Vital Signs BP 122/78 (BP Location: Right Arm)   Pulse 83   Temp 98.2 F (36.8 C) (Oral)   Resp 16   Ht 5\' 9"  (1.753 m)   Wt 187 lb (84.8 kg)   LMP 03/27/2017   SpO2 97%  BMI 27.62 kg/m   Visual Acuity Right Eye Distance:   Left Eye Distance:   Bilateral Distance:    Right Eye Near:   Left Eye Near:    Bilateral Near:     Physical Exam Nursing notes and Vital Signs reviewed. Appearance:  Patient appears stated age, and in no acute distress Eyes:  Pupils are equal, round, and reactive to light and accomodation.  Extraocular movement is intact.  Conjunctivae are not inflamed  Ears:  Canals normal.  Tympanic membranes normal.  Nose:  Congested turbinates.  Maxillary sinus tenderness is present.  Pharynx:  Normal Neck:  Supple.  Enlarged posterior/lateral nodes are palpated bilaterally, tender to palpation on the left.   Lungs:  Clear to auscultation.  Breath sounds are equal.  Moving air well. Heart:  Regular rate and rhythm without murmurs, rubs, or gallops.  Abdomen:  Nontender without masses or hepatosplenomegaly.  Bowel sounds are present.  No CVA or flank tenderness.  Extremities:  No edema.  Skin:  No rash present.    UC Treatments / Results  Labs (all labs ordered are listed, but only abnormal results are displayed) Labs Reviewed - No data  to display  EKG  EKG Interpretation None       Radiology No results found.  Procedures Procedures (including critical care time)  Medications Ordered in UC Medications - No data to display   Initial Impression / Assessment and Plan / UC Course  I have reviewed the triage vital signs and the nursing notes.  Pertinent labs & imaging results that were available during my care of the patient were reviewed by me and considered in my medical decision making (see chart for details).    There is no evidence of bacterial infection today.   Patient has a past history of sinus problems.  Will begin prednisone burst/taper. Take plain guaifenesin (1200mg  extended release tabs such as Mucinex) twice daily, with plenty of water, for cough and congestion.  Get adequate rest.   May use Afrin nasal spray (or generic oxymetazoline) each morning for about 5 days and then discontinue.  Also recommend using saline nasal spray several times daily and saline nasal irrigation (AYR is a common brand).  Use Flonase nasal spray each morning after using Afrin nasal spray and saline nasal irrigation. Try warm salt water gargles for sore throat.  Stop all antihistamines for now, and other non-prescription cough/cold preparations. Begin Azithromycin if not improving about one week or if persistent fever develops (Given a prescription to hold, with an expiration date)  Followup with Family Doctor if not improved in about 10 days.    Final Clinical Impressions(s) / UC Diagnoses   Final diagnoses:  Viral URI with cough    ED Discharge Orders        Ordered    predniSONE (DELTASONE) 20 MG tablet     04/10/17 1100    benzonatate (TESSALON) 200 MG capsule     04/10/17 1100    azithromycin (ZITHROMAX Z-PAK) 250 MG tablet     04/10/17 1101          Lattie HawBeese, Stephen A, MD 04/10/17 1105

## 2017-04-17 ENCOUNTER — Other Ambulatory Visit: Payer: Self-pay | Admitting: Obstetrics & Gynecology

## 2017-04-17 DIAGNOSIS — N6310 Unspecified lump in the right breast, unspecified quadrant: Secondary | ICD-10-CM

## 2017-05-20 ENCOUNTER — Ambulatory Visit
Admission: RE | Admit: 2017-05-20 | Discharge: 2017-05-20 | Disposition: A | Discharge status: Home / Self Care | Payer: Federal, State, Local not specified - PPO | Source: Ambulatory Visit | Attending: Obstetrics & Gynecology | Admitting: Obstetrics & Gynecology

## 2017-05-20 ENCOUNTER — Other Ambulatory Visit: Payer: Self-pay | Admitting: Obstetrics & Gynecology

## 2017-05-20 DIAGNOSIS — N6313 Unspecified lump in the right breast, lower outer quadrant: Secondary | ICD-10-CM | POA: Diagnosis not present

## 2017-05-20 DIAGNOSIS — N6310 Unspecified lump in the right breast, unspecified quadrant: Secondary | ICD-10-CM

## 2017-05-20 DIAGNOSIS — R928 Other abnormal and inconclusive findings on diagnostic imaging of breast: Secondary | ICD-10-CM | POA: Diagnosis not present

## 2017-05-27 ENCOUNTER — Ambulatory Visit
Admission: RE | Admit: 2017-05-27 | Discharge: 2017-05-27 | Disposition: A | Discharge status: Home / Self Care | Payer: Federal, State, Local not specified - PPO | Source: Ambulatory Visit | Attending: Obstetrics & Gynecology | Admitting: Obstetrics & Gynecology

## 2017-05-27 ENCOUNTER — Other Ambulatory Visit: Payer: Self-pay | Admitting: Obstetrics & Gynecology

## 2017-05-27 DIAGNOSIS — N6310 Unspecified lump in the right breast, unspecified quadrant: Secondary | ICD-10-CM

## 2017-05-27 DIAGNOSIS — D241 Benign neoplasm of right breast: Secondary | ICD-10-CM | POA: Diagnosis not present

## 2017-05-27 DIAGNOSIS — N6313 Unspecified lump in the right breast, lower outer quadrant: Secondary | ICD-10-CM | POA: Diagnosis not present

## 2017-05-27 HISTORY — PX: BREAST BIOPSY: SHX20

## 2017-06-13 ENCOUNTER — Encounter: Payer: Self-pay | Admitting: Obstetrics & Gynecology

## 2017-06-13 ENCOUNTER — Ambulatory Visit (INDEPENDENT_AMBULATORY_CARE_PROVIDER_SITE_OTHER): Payer: Federal, State, Local not specified - PPO | Admitting: Obstetrics & Gynecology

## 2017-06-13 VITALS — BP 128/78 | HR 74 | Ht 68.0 in | Wt 184.0 lb

## 2017-06-13 DIAGNOSIS — Z01419 Encounter for gynecological examination (general) (routine) without abnormal findings: Secondary | ICD-10-CM

## 2017-06-13 DIAGNOSIS — Z124 Encounter for screening for malignant neoplasm of cervix: Secondary | ICD-10-CM | POA: Diagnosis not present

## 2017-06-13 DIAGNOSIS — Z1151 Encounter for screening for human papillomavirus (HPV): Secondary | ICD-10-CM

## 2017-06-13 NOTE — Progress Notes (Signed)
Subjective:    Kari StainsLisa L Koopman is a 42 y.o.married P1 34(43 yo daughter)  female who presents for an annual exam. The patient has no complaints today.  The patient is sexually active. GYN screening history: last pap: was normal. The patient wears seatbelts: yes. The patient participates in regular exercise: yes. Has the patient ever been transfused or tattooed?: no. The patient reports that there is not domestic violence in her life.   Menstrual History: OB History    Gravida  1   Para  1   Term  1   Preterm  0   AB  0   Living  1     SAB  0   TAB  0   Ectopic  0   Multiple  0   Live Births  1           Menarche age: [age] Patient's last menstrual period was 05/19/2017.    The following portions of the patient's history were reviewed and updated as appropriate: allergies, current medications, past family history, past medical history, past social history, past surgical history and problem list.  Review of Systems Pertinent items are noted in HPI.   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) [place] for [employer] [place]  Married since 2005, denies dyspareunia Uses timing and withdrawal for contraception   Objective:    BP 128/78   Pulse 74   Ht 5\' 8"  (1.727 m)   Wt 184 lb (83.5 kg)   LMP 05/19/2017   BMI 27.98 kg/m   General Appearance:    Alert, cooperative, no distress, appears stated age  Head:    Normocephalic, without obvious abnormality, atraumatic  Eyes:    PERRL, conjunctiva/corneas clear, EOM's intact, fundi    benign, both eyes  Ears:    Normal TM's and external ear canals, both ears  Nose:   Nares normal, septum midline, mucosa normal, no drainage    or sinus tenderness  Throat:   Lips, mucosa, and tongue normal; teeth and gums normal  Neck:   Supple, symmetrical, trachea midline, no adenopathy;    thyroid:  no enlargement/tenderness/nodules; no carotid   bruit or JVD  Back:     Symmetric,  no curvature, ROM normal, no CVA tenderness  Lungs:     Clear to auscultation bilaterally, respirations unlabored  Chest Wall:    No tenderness or deformity   Heart:    Regular rate and rhythm, S1 and S2 normal, no murmur, rub   or gallop  Breast Exam:    No tenderness, masses, or nipple abnormality  Abdomen:     Soft, non-tender, bowel sounds active all four quadrants,    no masses, no organomegaly, cesarean section scar noted  Genitalia:    Normal female without lesion, discharge or tenderness, normal size and shape, mid plane, non-mobile, non-tender, normal adnexal exam      Extremities:   Extremities normal, atraumatic, no cyanosis or edema  Pulses:   2+ and symmetric all extremities  Skin:   Skin color, texture, turgor normal, no rashes or lesions  Lymph nodes:   Cervical, supraclavicular, and axillary nodes normal  Neurologic:   CNII-XII intact, normal strength, sensation and reflexes    throughout  .    Assessment:    Healthy female exam.    Plan:     Thin prep Pap smear. with cotesting Fasting labs at her convenience

## 2017-06-14 LAB — CYTOLOGY - PAP
Adequacy: ABSENT
DIAGNOSIS: NEGATIVE
HPV (WINDOPATH): NOT DETECTED

## 2017-06-18 ENCOUNTER — Other Ambulatory Visit: Payer: Federal, State, Local not specified - PPO

## 2017-06-18 DIAGNOSIS — Z01419 Encounter for gynecological examination (general) (routine) without abnormal findings: Secondary | ICD-10-CM | POA: Diagnosis not present

## 2017-06-19 ENCOUNTER — Telehealth: Payer: Self-pay | Admitting: *Deleted

## 2017-06-19 LAB — COMPREHENSIVE METABOLIC PANEL
AG Ratio: 2 (calc) (ref 1.0–2.5)
ALT: 17 U/L (ref 6–29)
AST: 14 U/L (ref 10–30)
Albumin: 4.3 g/dL (ref 3.6–5.1)
Alkaline phosphatase (APISO): 52 U/L (ref 33–115)
BUN: 15 mg/dL (ref 7–25)
CO2: 22 mmol/L (ref 20–32)
CREATININE: 0.69 mg/dL (ref 0.50–1.10)
Calcium: 8.8 mg/dL (ref 8.6–10.2)
Chloride: 109 mmol/L (ref 98–110)
GLUCOSE: 88 mg/dL (ref 65–99)
Globulin: 2.2 g/dL (calc) (ref 1.9–3.7)
Potassium: 4.2 mmol/L (ref 3.5–5.3)
Sodium: 139 mmol/L (ref 135–146)
TOTAL PROTEIN: 6.5 g/dL (ref 6.1–8.1)
Total Bilirubin: 0.4 mg/dL (ref 0.2–1.2)

## 2017-06-19 LAB — CBC
HCT: 41.8 % (ref 35.0–45.0)
Hemoglobin: 14.4 g/dL (ref 11.7–15.5)
MCH: 30.4 pg (ref 27.0–33.0)
MCHC: 34.4 g/dL (ref 32.0–36.0)
MCV: 88.2 fL (ref 80.0–100.0)
MPV: 11.4 fL (ref 7.5–12.5)
PLATELETS: 181 10*3/uL (ref 140–400)
RBC: 4.74 10*6/uL (ref 3.80–5.10)
RDW: 12.7 % (ref 11.0–15.0)
WBC: 4.5 10*3/uL (ref 3.8–10.8)

## 2017-06-19 LAB — LIPID PANEL
Cholesterol: 167 mg/dL (ref <200)
HDL: 55 mg/dL (ref >50)
LDL Cholesterol (Calc): 96 mg/dL (calc)
NON-HDL CHOLESTEROL (CALC): 112 mg/dL (ref <130)
Total CHOL/HDL Ratio: 3 (calc) (ref <5.0)
Triglycerides: 69 mg/dL (ref <150)

## 2017-06-19 LAB — VITAMIN D 25 HYDROXY (VIT D DEFICIENCY, FRACTURES): Vit D, 25-Hydroxy: 22 ng/mL — ABNORMAL LOW (ref 30–100)

## 2017-06-19 LAB — TSH: TSH: 1.53 m[IU]/L

## 2017-06-19 MED ORDER — VITAMIN D (ERGOCALCIFEROL) 1.25 MG (50000 UNIT) PO CAPS: 50000.0000 [IU] | ORAL_CAPSULE | ORAL | 0 refills | Status: DC

## 2017-06-19 NOTE — Telephone Encounter (Signed)
LM on voicemail of normal labs except for low Vitamin D.  Per Dr Marice Potterove a [facility] Vitamin D [facility]K [facility] [place] for [facility].  She is to return in 5 months for repeat Vitamin D level.

## 2017-06-19 NOTE — Telephone Encounter (Signed)
-----   Message from Allie BossierMyra C Dove, MD sent at 06/19/2017  1:25 PM EDT ----- She will need 8 weeks of weekly Vitamin D 50,000 units and then a recheck of her level in 5 months. Thanks

## 2017-10-21 ENCOUNTER — Other Ambulatory Visit: Payer: Federal, State, Local not specified - PPO

## 2017-10-21 DIAGNOSIS — R7989 Other specified abnormal findings of blood chemistry: Secondary | ICD-10-CM | POA: Diagnosis not present

## 2017-10-21 DIAGNOSIS — K080 Exfoliation of teeth due to systemic causes: Secondary | ICD-10-CM | POA: Diagnosis not present

## 2017-10-22 LAB — VITAMIN D 25 HYDROXY (VIT D DEFICIENCY, FRACTURES): VIT D 25 HYDROXY: 31 ng/mL (ref 30–100)

## 2018-04-07 DIAGNOSIS — K080 Exfoliation of teeth due to systemic causes: Secondary | ICD-10-CM | POA: Diagnosis not present

## 2018-05-08 ENCOUNTER — Other Ambulatory Visit: Payer: Self-pay | Admitting: Obstetrics & Gynecology

## 2018-05-08 DIAGNOSIS — Z1231 Encounter for screening mammogram for malignant neoplasm of breast: Secondary | ICD-10-CM

## 2018-06-16 ENCOUNTER — Ambulatory Visit: Payer: Federal, State, Local not specified - PPO

## 2018-06-23 ENCOUNTER — Ambulatory Visit: Payer: Federal, State, Local not specified - PPO

## 2018-08-11 ENCOUNTER — Other Ambulatory Visit: Payer: Self-pay

## 2018-08-11 ENCOUNTER — Ambulatory Visit
Admission: RE | Admit: 2018-08-11 | Discharge: 2018-08-11 | Disposition: A | Discharge status: Home / Self Care | Payer: Federal, State, Local not specified - PPO | Source: Ambulatory Visit | Attending: Obstetrics & Gynecology | Admitting: Obstetrics & Gynecology

## 2018-08-11 DIAGNOSIS — Z1231 Encounter for screening mammogram for malignant neoplasm of breast: Secondary | ICD-10-CM

## 2019-08-02 ENCOUNTER — Other Ambulatory Visit: Payer: Self-pay

## 2019-08-02 ENCOUNTER — Emergency Department (INDEPENDENT_AMBULATORY_CARE_PROVIDER_SITE_OTHER)
Admission: EM | Admit: 2019-08-02 | Discharge: 2019-08-02 | Disposition: A | Discharge status: Home / Self Care | Payer: Federal, State, Local not specified - PPO | Source: Home / Self Care

## 2019-08-02 ENCOUNTER — Encounter: Payer: Self-pay | Admitting: Emergency Medicine

## 2019-08-02 DIAGNOSIS — S61215A Laceration without foreign body of left ring finger without damage to nail, initial encounter: Secondary | ICD-10-CM | POA: Diagnosis not present

## 2019-08-02 DIAGNOSIS — S61213A Laceration without foreign body of left middle finger without damage to nail, initial encounter: Secondary | ICD-10-CM | POA: Diagnosis not present

## 2019-08-02 DIAGNOSIS — Z23 Encounter for immunization: Secondary | ICD-10-CM | POA: Diagnosis not present

## 2019-08-02 MED ORDER — TETANUS-DIPHTH-ACELL PERTUSSIS 5-2.5-18.5 LF-MCG/0.5 IM SUSP
0.5000 mL | Freq: Once | INTRAMUSCULAR | Status: AC
  Administered 2019-08-02: 0.5 mL via INTRAMUSCULAR

## 2019-08-02 NOTE — Discharge Instructions (Signed)
  Keep bandages in place for 24-48 hours, then gently remove and clean with warm water and soap. Pat dry and apply a clean dry bandage to help keep wounds protected as they heal.  Return in 10-14 days for suture removal, return sooner if you develop worsening pain, swelling, redness, drainage of pus or fever.

## 2019-08-02 NOTE — ED Triage Notes (Signed)
Patient cut her left middle and ring finger today with hedge trimmers, unsure of last Td.

## 2019-08-02 NOTE — ED Provider Notes (Signed)
[facility]    CSN: [id number] Arrival date & time: 08/02/19  1230      History   Chief Complaint Chief Complaint  Patient presents with  . Laceration    HPI [name] is a 44 y.o. female.   HPI  [name] is a 44 y.o. female presenting to UC with c/o laceration to her Left ring and middle finger about 1 hour PTA after cutting it on an electric hedge trimmer w/o gloves.  Bleeding controlled PTA. Pain is 3/10, no medication taken PTA. Pt is unsure of her last tetanus. She is Right hand dominant.     History reviewed. No pertinent past medical history.  There are no problems to display for this patient.   Past Surgical History:  Procedure Laterality Date  . ANKLE SURGERY    . BREAST BIOPSY    . CESAREAN SECTION      OB History    Gravida  1   Para  1   Term  1   Preterm  0   AB  0   Living  1     SAB  0   TAB  0   Ectopic  0   Multiple  0   Live Births  1            Home Medications    Prior to Admission medications   Medication Sig Start Date End Date Taking? Authorizing Provider  azithromycin (ZITHROMAX Z-PAK) 250 MG tablet Take 2 tabs today; then begin one tab once daily for 4 more days. (Rx void after 04/18/17) Patient not taking: Reported on 06/13/2017 04/10/17   Lattie Haw, MD  benzonatate (TESSALON) 200 MG capsule Take one cap by mouth at bedtime as needed for cough.  May repeat in 4 to 6 hours Patient not taking: Reported on 06/13/2017 04/10/17   Lattie Haw, MD  predniSONE (DELTASONE) 20 MG tablet Take one tab by mouth twice daily for 4 days, then one daily for 3 days. Take with food. Patient not taking: Reported on 06/13/2017 04/10/17   Lattie Haw, MD  Vitamin D, Ergocalciferol, (DRISDOL) 50000 units CAPS capsule Take 1 capsule (50,000 Units total) by mouth every 7 (seven) days. 06/19/17   Allie Bossier, MD    Family History No family history on file.  Social History Social History   Tobacco Use  .  Smoking status: Never Smoker  . Smokeless tobacco: Never Used  Substance Use Topics  . Alcohol use: No    Alcohol/week: 0.0 standard drinks  . Drug use: No     Allergies   Patient has no known allergies.   Review of Systems Review of Systems  Musculoskeletal: Negative for arthralgias and joint swelling.  Skin: Positive for wound. Negative for color change.  Neurological: Negative for weakness and numbness.     Physical Exam Triage Vital Signs ED Triage Vitals  Enc Vitals Group     BP 08/02/19 1247 (!) 142/95     Pulse Rate 08/02/19 1247 85     Resp --      Temp --      Temp src --      SpO2 08/02/19 1247 98 %     Weight --      Height --      Head Circumference --      Peak Flow --      Pain Score 08/02/19 1248 3     Pain Loc --  Pain Edu? --      Excl. in GC? --    No data found.  Updated Vital Signs BP (!) 142/95 (BP Location: Right Arm)   Pulse 85   LMP 07/25/2019   SpO2 98%   Visual Acuity Right Eye Distance:   Left Eye Distance:   Bilateral Distance:    Right Eye Near:   Left Eye Near:    Bilateral Near:     Physical Exam Vitals and nursing note reviewed.  Constitutional:      Appearance: Normal appearance. She is well-developed.  HENT:     Head: Normocephalic and atraumatic.  Cardiovascular:     Rate and Rhythm: Normal rate.  Pulmonary:     Effort: Pulmonary effort is normal.  Musculoskeletal:        General: Normal range of motion.     Cervical back: Normal range of motion.     Comments: Left hand: full ROM, lacerations to distal ring and middle finger.   Skin:    General: Skin is warm and dry.     Capillary Refill: Capillary refill takes less than 2 seconds.     Comments: Left ring finger, volar aspect, distal phalanx: 1cm superficial laceration. Oozing small amount of red blood. No foreign bodies seen. Left middle finger, distal volar aspect: 5cm jagged flap-like laceration. Bleeding controlled. No foreign bodies seen or  palpated. No tendon or bone involvement.   Neurological:     Mental Status: She is alert and oriented to person, place, and time.     Sensory: No sensory deficit.  Psychiatric:        Behavior: Behavior normal.      UC Treatments / Results  Labs (all labs ordered are listed, but only abnormal results are displayed) Labs Reviewed - No data to display  EKG   Radiology No results found.  Procedures Laceration Repair  Date/Time: 08/02/2019 3:02 PM Performed by: Lurene Shadow, PA-C Authorized by: Lurene Shadow, PA-C   Consent:    Consent obtained:  Verbal   Consent given by:  Patient   Risks discussed:  Infection, pain, poor cosmetic result and poor wound healing   Alternatives discussed:  No treatment and delayed treatment Anesthesia (see MAR for exact dosages):    Anesthesia method:  None Laceration details:    Location:  Finger   Finger location:  L ring finger   Length (cm):  1   Depth (mm):  2 Repair type:    Repair type:  Simple Pre-procedure details:    Preparation:  Patient was prepped and draped in usual sterile fashion Exploration:    Hemostasis achieved with:  Direct pressure   Wound exploration: wound explored through full range of motion and entire depth of wound probed and visualized     Wound extent: no areolar tissue violation noted, no fascia violation noted, no foreign bodies/material noted, no muscle damage noted, no nerve damage noted, no tendon damage noted, no underlying fracture noted and no vascular damage noted     Contaminated: no   Treatment:    Area cleansed with:  Saline   Amount of cleaning:  Standard Skin repair:    Repair method:  Steri-Strips   Number of Steri-Strips:  2 Approximation:    Approximation:  Close Post-procedure details:    Dressing:  Bulky dressing and non-adherent dressing   Patient tolerance of procedure:  Tolerated well, no immediate complications Laceration Repair  Date/Time: 08/02/2019 3:03 PM Performed by:  Lurene Shadow,  PA-C Authorized by: Noe Gens, PA-C   Consent:    Consent obtained:  Verbal   Consent given by:  Patient   Risks discussed:  Infection, pain, poor cosmetic result, poor wound healing, need for additional repair, nerve damage, tendon damage, retained foreign body and vascular damage   Alternatives discussed:  No treatment and delayed treatment Anesthesia (see MAR for exact dosages):    Anesthesia method:  Local infiltration and nerve block   Local anesthetic:  Lidocaine 2% w/o epi   Block location:  Digital   Block needle gauge:  25 G   Block anesthetic:  Lidocaine 2% w/o epi   Block technique:  Digital   Block injection procedure:  Anatomic landmarks identified, negative aspiration for blood, introduced needle, incremental injection and anatomic landmarks palpated   Block outcome:  Anesthesia achieved Laceration details:    Location:  Finger   Finger location:  L long finger   Length (cm):  5   Depth (mm):  3 Repair type:    Repair type:  Simple Pre-procedure details:    Preparation:  Patient was prepped and draped in usual sterile fashion Exploration:    Hemostasis achieved with:  Direct pressure   Wound exploration: wound explored through full range of motion and entire depth of wound probed and visualized     Wound extent: areolar tissue violated     Wound extent: no fascia violation noted, no foreign bodies/material noted, no muscle damage noted, no nerve damage noted, no tendon damage noted, no underlying fracture noted and no vascular damage noted     Contaminated: no   Treatment:    Area cleansed with:  Saline   Amount of cleaning:  Standard Skin repair:    Repair method:  Sutures   Suture size:  4-0   Suture material:  Prolene   Suture technique:  Simple interrupted   Number of sutures:  5 Approximation:    Approximation:  Close Post-procedure details:    Dressing:  Antibiotic ointment and bulky dressing   Patient tolerance of procedure:   Tolerated well, no immediate complications   (including critical care time)  Medications Ordered in UC Medications  Tdap (BOOSTRIX) injection 0.5 mL (0.5 mLs Intramuscular Given 08/02/19 1348)    Initial Impression / Assessment and Plan / UC Course  I have reviewed the triage vital signs and the nursing notes.  Pertinent labs & imaging results that were available during my care of the patient were reviewed by me and considered in my medical decision making (see chart for details).    Wounds to Left ring and middle finger closed as noted above Tdap updated AVS provided  Final Clinical Impressions(s) / UC Diagnoses   Final diagnoses:  Laceration of left middle finger without foreign body without damage to nail, initial encounter  Laceration of left ring finger without foreign body without damage to nail, initial encounter     Discharge Instructions      Keep bandages in place for 24-48 hours, then gently remove and clean with warm water and soap. Pat dry and apply a clean dry bandage to help keep wounds protected as they heal.  Return in 10-14 days for suture removal, return sooner if you develop worsening pain, swelling, redness, drainage of pus or fever.     ED Prescriptions    None     PDMP not reviewed this encounter.   Noe Gens, PA-C 08/02/19 1505

## 2019-09-30 ENCOUNTER — Other Ambulatory Visit: Payer: Self-pay | Admitting: Obstetrics & Gynecology

## 2019-09-30 DIAGNOSIS — Z1231 Encounter for screening mammogram for malignant neoplasm of breast: Secondary | ICD-10-CM

## 2019-10-23 ENCOUNTER — Other Ambulatory Visit: Payer: Self-pay

## 2019-10-23 ENCOUNTER — Other Ambulatory Visit: Payer: Self-pay | Admitting: Obstetrics and Gynecology

## 2019-10-23 ENCOUNTER — Ambulatory Visit
Admission: RE | Admit: 2019-10-23 | Discharge: 2019-10-23 | Disposition: A | Discharge status: Home / Self Care | Payer: Federal, State, Local not specified - PPO | Source: Ambulatory Visit | Attending: *Deleted | Admitting: *Deleted

## 2019-10-23 DIAGNOSIS — Z1231 Encounter for screening mammogram for malignant neoplasm of breast: Secondary | ICD-10-CM | POA: Diagnosis not present

## 2019-11-02 ENCOUNTER — Other Ambulatory Visit (HOSPITAL_COMMUNITY)
Admission: RE | Admit: 2019-11-02 | Discharge: 2019-11-02 | Disposition: A | Discharge status: Home / Self Care | Payer: Federal, State, Local not specified - PPO | Source: Ambulatory Visit | Attending: Obstetrics and Gynecology | Admitting: Obstetrics and Gynecology

## 2019-11-02 ENCOUNTER — Ambulatory Visit (INDEPENDENT_AMBULATORY_CARE_PROVIDER_SITE_OTHER): Payer: Federal, State, Local not specified - PPO | Admitting: Obstetrics and Gynecology

## 2019-11-02 ENCOUNTER — Other Ambulatory Visit: Payer: Self-pay

## 2019-11-02 ENCOUNTER — Encounter: Payer: Self-pay | Admitting: Obstetrics and Gynecology

## 2019-11-02 VITALS — BP 124/77 | HR 81 | Resp 16 | Ht 68.0 in | Wt 185.0 lb

## 2019-11-02 DIAGNOSIS — Z124 Encounter for screening for malignant neoplasm of cervix: Secondary | ICD-10-CM | POA: Diagnosis not present

## 2019-11-02 DIAGNOSIS — Z01419 Encounter for gynecological examination (general) (routine) without abnormal findings: Secondary | ICD-10-CM | POA: Diagnosis not present

## 2019-11-02 DIAGNOSIS — Z23 Encounter for immunization: Secondary | ICD-10-CM

## 2019-11-02 NOTE — Progress Notes (Signed)
GYNECOLOGY ANNUAL PREVENTATIVE CARE ENCOUNTER NOTE  Subjective:   [name] is a 44 y.o. G69P1001 female here for a annual gynecologic exam. Current complaints: none. Has monthly periods lasting 4 days, every 3 months they are 6 days and heavy. Not bothersome to her.     Denies abnormal vaginal bleeding, discharge, pelvic pain, problems with intercourse or other gynecologic concerns. Declines STI screen.   Gynecologic History Patient's last menstrual period was 10/16/2019. Contraception: none Last Pap: 2019. Results: normal Last mammogram: 10/2019. Results: Bir[facility] 1 DEXA: has never had  Obstetric History OB History  Gravida Para Term Preterm AB Living  1 1 1  0 0 1  SAB TAB Ectopic Multiple Live Births  0 0 0 0 1    # Outcome Date GA Lbr Len/2nd Weight Sex Delivery Anes PTL Lv  1 Term 09/14/06    F CS-Unspec   LIV    History reviewed. No pertinent past medical history.  Past Surgical History:  Procedure Laterality Date  . ANKLE SURGERY    . BREAST BIOPSY Left   . CESAREAN SECTION      Current Outpatient Medications on File Prior to Visit  Medication Sig Dispense Refill  . Multiple Vitamin (MULTIVITAMIN) tablet Take 1 tablet by mouth daily.    11/15/06 azithromycin (ZITHROMAX Z-PAK) 250 MG tablet Take 2 tabs today; then begin one tab once daily for 4 more days. (Rx void after 04/18/17) (Patient not taking: Reported on 06/13/2017) 6 tablet 0  . benzonatate (TESSALON) 200 MG capsule Take one cap by mouth at bedtime as needed for cough.  May repeat in 4 to 6 hours (Patient not taking: Reported on 06/13/2017) 15 capsule 0  . predniSONE (DELTASONE) 20 MG tablet Take one tab by mouth twice daily for 4 days, then one daily for 3 days. Take with food. (Patient not taking: Reported on 06/13/2017) 11 tablet 0  . Vitamin D, Ergocalciferol, (DRISDOL) 50000 units CAPS capsule Take 1 capsule (50,000 Units total) by mouth every 7 (seven) days. (Patient not taking: Reported on 11/02/2019) 8 capsule  0   No current facility-administered medications on file prior to visit.    No Known Allergies  Social History   Socioeconomic History  . Marital status: Married    Spouse name: Not on file  . Number of children: Not on file  . Years of education: Not on file  . Highest education level: Not on file  Occupational History  . Not on file  Tobacco Use  . Smoking status: Never Smoker  . Smokeless tobacco: Never Used  Vaping Use  . Vaping Use: Never used  Substance and Sexual Activity  . Alcohol use: No    Alcohol/week: 0.0 standard drinks  . Drug use: No  . Sexual activity: Yes    Partners: Male    Birth control/protection: None  Other Topics Concern  . Not on file  Social History Narrative  . Not on file   Social Determinants of Health   Financial Resource Strain:   . Difficulty of Paying Living Expenses: Not on file  Food Insecurity:   . Worried About 11/04/2019 in the Last Year: Not on file  . Ran Out of Food in the Last Year: Not on file  Transportation Needs:   . Lack of Transportation (Medical): Not on file  . Lack of Transportation (Non-Medical): Not on file  Physical Activity:   . Days of Exercise per Week: Not on file  .  Minutes of Exercise per Session: Not on file  Stress:   . Feeling of Stress : Not on file  Social Connections:   . Frequency of Communication with Friends and Family: Not on file  . Frequency of Social Gatherings with Friends and Family: Not on file  . Attends Religious Services: Not on file  . Active Member of Clubs or Organizations: Not on file  . Attends Banker Meetings: Not on file  . Marital Status: Not on file  Intimate Partner Violence:   . Fear of Current or Ex-Partner: Not on file  . Emotionally Abused: Not on file  . Physically Abused: Not on file  . Sexually Abused: Not on file    Family History  Problem Relation Age of Onset  . Breast cancer Maternal Grandmother     The following portions of  the patient's history were reviewed and updated as appropriate: allergies, current medications, past family history, past medical history, past social history, past surgical history and problem list.  Review of Systems Pertinent items are noted in HPI.   Objective:  BP 124/77   Pulse 81   Resp 16   Ht 5\' 8"  (1.727 m)   Wt 185 lb (83.9 kg)   LMP 10/16/2019   BMI 28.13 kg/m  CONSTITUTIONAL: Well-developed, well-nourished female in no acute distress.  HENT:  Normocephalic, atraumatic, External right and left ear normal. Oropharynx is clear and moist EYES: Conjunctivae and EOM are normal. Pupils are equal, round, and reactive to light. No scleral icterus.  NECK: Normal range of motion, supple, no masses.  Normal thyroid.  SKIN: Skin is warm and dry. No rash noted. Not diaphoretic. No erythema. No pallor. NEUROLOGIC: Alert and oriented to person, place, and time. Normal reflexes, muscle tone coordination. No cranial nerve deficit noted. PSYCHIATRIC: Normal mood and affect. Normal behavior. Normal judgment and thought content. CARDIOVASCULAR: Normal heart rate noted, regular rhythm RESPIRATORY: Clear to auscultation bilaterally. Effort and breath sounds normal, no problems with respiration noted. BREASTS: Symmetric in size. No masses, skin changes, nipple drainage, or lymphadenopathy. ABDOMEN: Soft, no distention noted.  No tenderness, rebound or guarding.  PELVIC: Normal appearing external genitalia; normal appearing vaginal mucosa and cervix.  No abnormal discharge noted.  Pap smear obtained. Normal uterine size, no other palpable masses, no uterine or adnexal tenderness. MUSCULOSKELETAL: Normal range of motion. No tenderness.  No cyanosis, clubbing, or edema.  2+ distal pulses.  Exam done with chaperone present.  FRAX score done 11/02/19: BMI: 28.1 The ten year probability of fracture (%) without BMD Major osteoporotic: 2.1 Hip Fracture: 0.1  Assessment and Plan:  1. Well woman  exam Healthy female exam - Ambulatory referral to Gastroenterology - Cytology - PAP( [place])  2. Cervical cancer screening - Cytology - PAP( [place])  3. Needs flu shot - Flu Vaccine QUAD 36+ mos IM   Will follow up results of pap smear screen and manage accordingly. Encouraged improvement in diet and exercise.  COVID vaccine 4/16/UTD Declines STI screen. Mammogram UTD Referral for colonoscopy today Flu vaccine today DEXA not due based on FRAX/age  Routine preventative health maintenance measures emphasized. Please refer to After Visit Summary for other counseling recommendations.   Total face-to-face time with patient: 25 minutes. Over 50% of encounter was spent on counseling and coordination of care.   03-10-1982, M.D. Attending Center for [facility] [employer])

## 2019-11-03 LAB — CYTOLOGY - PAP
Comment: NEGATIVE
Diagnosis: NEGATIVE
High risk HPV: NEGATIVE

## 2020-03-25 ENCOUNTER — Encounter: Payer: Self-pay | Admitting: Obstetrics and Gynecology

## 2020-10-05 ENCOUNTER — Other Ambulatory Visit: Payer: Self-pay | Admitting: Obstetrics and Gynecology

## 2020-10-05 DIAGNOSIS — Z1231 Encounter for screening mammogram for malignant neoplasm of breast: Secondary | ICD-10-CM

## 2020-11-25 ENCOUNTER — Ambulatory Visit
Admission: RE | Admit: 2020-11-25 | Discharge: 2020-11-25 | Disposition: A | Discharge status: Home / Self Care | Payer: Federal, State, Local not specified - PPO | Source: Ambulatory Visit | Attending: Obstetrics and Gynecology | Admitting: Obstetrics and Gynecology

## 2020-11-25 ENCOUNTER — Other Ambulatory Visit: Payer: Self-pay

## 2020-11-25 DIAGNOSIS — Z1231 Encounter for screening mammogram for malignant neoplasm of breast: Secondary | ICD-10-CM

## 2021-02-02 DIAGNOSIS — L820 Inflamed seborrheic keratosis: Secondary | ICD-10-CM | POA: Diagnosis not present

## 2021-02-02 DIAGNOSIS — L578 Other skin changes due to chronic exposure to nonionizing radiation: Secondary | ICD-10-CM | POA: Diagnosis not present

## 2021-02-02 DIAGNOSIS — X32XXXS Exposure to sunlight, sequela: Secondary | ICD-10-CM | POA: Diagnosis not present

## 2021-02-02 DIAGNOSIS — L814 Other melanin hyperpigmentation: Secondary | ICD-10-CM | POA: Diagnosis not present

## 2021-02-02 DIAGNOSIS — Z86018 Personal history of other benign neoplasm: Secondary | ICD-10-CM | POA: Diagnosis not present

## 2021-03-27 DIAGNOSIS — Z20822 Contact with and (suspected) exposure to covid-19: Secondary | ICD-10-CM | POA: Diagnosis not present

## 2022-01-17 ENCOUNTER — Other Ambulatory Visit: Payer: Self-pay | Admitting: Obstetrics and Gynecology

## 2022-01-17 DIAGNOSIS — Z1231 Encounter for screening mammogram for malignant neoplasm of breast: Secondary | ICD-10-CM

## 2022-02-06 ENCOUNTER — Ambulatory Visit
Admission: RE | Admit: 2022-02-06 | Discharge: 2022-02-06 | Disposition: A | Discharge status: Home / Self Care | Payer: Federal, State, Local not specified - PPO | Source: Ambulatory Visit | Attending: Family Medicine | Admitting: Family Medicine

## 2022-02-06 VITALS — BP 136/84 | HR 67 | Temp 98.2°F | Resp 16

## 2022-02-06 DIAGNOSIS — J040 Acute laryngitis: Secondary | ICD-10-CM | POA: Diagnosis not present

## 2022-02-06 DIAGNOSIS — J069 Acute upper respiratory infection, unspecified: Secondary | ICD-10-CM | POA: Diagnosis not present

## 2022-02-06 MED ORDER — PREDNISONE 20 MG PO TABS: ORAL_TABLET | ORAL | 0 refills | Status: DC

## 2022-02-06 MED ORDER — PROMETHAZINE-DM 6.25-15 MG/5ML PO SYRP: 5.0000 mL | ORAL_SOLUTION | Freq: Four times a day (QID) | ORAL | 0 refills | Status: DC | PRN

## 2022-02-06 MED ORDER — AZITHROMYCIN 250 MG PO TABS: ORAL_TABLET | ORAL | 0 refills | Status: DC

## 2022-02-06 NOTE — ED Triage Notes (Signed)
Patient presents to Urgent Care with complaints of productive cough, sore throat, hoarseness since 5-6 days ago. Patient reports using Afrin to help with opening up sinuses, unable to sleep due to cough.  Denies any fever or chills.

## 2022-02-06 NOTE — ED Provider Notes (Signed)
[name]    CSN: [id number] Arrival date & time: 02/06/22  1749      History   Chief Complaint Chief Complaint  Patient presents with   Cough    Sore throat and little to no voice since last Thursday. Cannot rest laying flat, wake up coughing and choking. No fever but sore throat and congestion. - Entered by patient   Sore Throat    HPI NDIA [name] is a 46 y.o. female.   HPI  Patient has a "head cold" with runny stuffy nose, postnasal drip, laryngitis, cough, hoarse voice since Thursday.  She states that she cannot rest lying flat.  She feels like the mucus is clogging her throat.  She has been using Afrin so she can breathe through her nose.  No shortness of breath.  She is coughing and has some cough congestion.  No known exposure to flu or COVID.  Symptoms been going on for 5 to 6 days  History reviewed. No pertinent past medical history.  There are no problems to display for this patient.   Past Surgical History:  Procedure Laterality Date   ANKLE SURGERY     BREAST BIOPSY Right 05/27/2017   CESAREAN SECTION      OB History     Gravida  1   Para  1   Term  1   Preterm  0   AB  0   Living  1      SAB  0   IAB  0   Ectopic  0   Multiple  0   Live Births  1            Home Medications    Prior to Admission medications   Medication Sig Start Date End Date Taking? Authorizing Provider  Multiple Vitamin (MULTIVITAMIN) tablet Take 1 tablet by mouth daily.   Yes [provider]  promethazine-dextromethorphan (PROMETHAZINE-DM) 6.25-15 MG/5ML syrup Take 5 mLs by mouth 4 (four) times daily as needed for cough. 02/06/22  Yes Raylene Everts, MD  azithromycin (ZITHROMAX Z-PAK) 250 MG tablet Take 2 tabs today; then begin one tab once daily for 4 more days. (Rx void after 04/18/17) 02/06/22   Raylene Everts, MD  predniSONE (DELTASONE) 20 MG tablet Take one tab by mouth twice daily for 4 days, then one daily for 3 days. Take  with food. 02/06/22   Raylene Everts, MD    Family History Family History  Problem Relation Age of Onset   Breast cancer Maternal Grandmother     Social History Social History   Tobacco Use   Smoking status: Never   Smokeless tobacco: Never  Vaping Use   Vaping Use: Never used  Substance Use Topics   Alcohol use: No    Alcohol/week: 0.0 standard drinks of alcohol   Drug use: No     Allergies   Patient has no known allergies.   Review of Systems Review of Systems See HPI  Physical Exam Triage Vital Signs ED Triage Vitals  Enc Vitals Group     BP 02/06/22 1802 136/84     Pulse Rate 02/06/22 1802 67     Resp 02/06/22 1802 16     Temp 02/06/22 1802 98.2 F (36.8 C)     Temp Source 02/06/22 1802 Oral     SpO2 02/06/22 1802 95 %     Weight --      Height --      Head Circumference --  Peak Flow --      Pain Score 02/06/22 1801 7     Pain Loc --      Pain Edu? --      Excl. in GC? --    No data found.  Updated Vital Signs BP 136/84 (BP Location: Left Arm)   Pulse 67   Temp 98.2 F (36.8 C) (Oral)   Resp 16   SpO2 95%      Physical Exam Constitutional:      General: She is not in acute distress.    Appearance: She is well-developed.  HENT:     Head: Normocephalic and atraumatic.     Right Ear: Tympanic membrane and ear canal normal.     Left Ear: Tympanic membrane and ear canal normal.     Nose: Congestion and rhinorrhea present.     Mouth/Throat:     Pharynx: Pharyngeal swelling and posterior oropharyngeal erythema present.     Tonsils: No tonsillar exudate or tonsillar abscesses.  Eyes:     Conjunctiva/sclera: Conjunctivae normal.     Pupils: Pupils are equal, round, and reactive to light.  Cardiovascular:     Rate and Rhythm: Normal rate and regular rhythm.     Heart sounds: Normal heart sounds.  Pulmonary:     Effort: Pulmonary effort is normal. No respiratory distress.     Breath sounds: Normal breath sounds.  Abdominal:      General: There is no distension.     Palpations: Abdomen is soft.  Musculoskeletal:        General: Normal range of motion.     Cervical back: Normal range of motion.  Lymphadenopathy:     Cervical: Cervical adenopathy present.  Skin:    General: Skin is warm and dry.  Neurological:     Mental Status: She is alert.      UC Treatments / Results  Labs (all labs ordered are listed, but only abnormal results are displayed) Labs Reviewed - No data to display  EKG   Radiology No results found.  Procedures Procedures (including critical care time)  Medications Ordered in UC Medications - No data to display  Initial Impression / Assessment and Plan / UC Course  I have reviewed the triage vital signs and the nursing notes.  Pertinent labs & imaging results that were available during my care of the patient were reviewed by me and considered in my medical decision making (see chart for details).     Discussed viral URI.  Fill and take antibiotic only if fails to improve over the next 2 to 3 days Final Clinical Impressions(s) / UC Diagnoses   Final diagnoses:  Laryngitis  Viral upper respiratory tract infection     Discharge Instructions      Make sure you are drinking lots of fluids Take the prednisone as directed May continue Afrin, but stop as soon as you are able I have prescribed a stronger cough medicine to take at bedtime.  This can cause drowsiness If you do not see improvement over the next couple of days, fill and take the Z-Pak See your doctor if not improving by next week   ED Prescriptions     Medication Sig Dispense Auth. Provider   azithromycin (ZITHROMAX Z-PAK) 250 MG tablet Take 2 tabs today; then begin one tab once daily for 4 more days. (Rx void after 04/18/17) 6 tablet Eustace Moore, MD   predniSONE (DELTASONE) 20 MG tablet Take one tab by mouth twice daily  for 4 days, then one daily for 3 days. Take with food. 11 tablet Raylene Everts,  MD   promethazine-dextromethorphan (PROMETHAZINE-DM) 6.25-15 MG/5ML syrup Take 5 mLs by mouth 4 (four) times daily as needed for cough. 118 mL Raylene Everts, MD      PDMP not reviewed this encounter.   Raylene Everts, MD 02/06/22 1910

## 2022-02-06 NOTE — Discharge Instructions (Signed)
Make sure you are drinking lots of fluids Take the prednisone as directed May continue Afrin, but stop as soon as you are able I have prescribed a stronger cough medicine to take at bedtime.  This can cause drowsiness If you do not see improvement over the next couple of days, fill and take the Z-Pak See your doctor if not improving by next week

## 2022-02-07 ENCOUNTER — Telehealth: Payer: Self-pay

## 2022-02-07 IMAGING — MG MM DIGITAL SCREENING BILAT W/ TOMO AND CAD
8 series · 8 of 24 positions shown · non-contrast
Comparison: Previous exam(s).

CLINICAL DATA: Screening.

EXAM:
DIGITAL SCREENING BILATERAL MAMMOGRAM WITH TOMOSYNTHESIS AND CAD
TECHNIQUE: Bilateral screening digital craniocaudal and mediolateral oblique
mammograms were obtained. Bilateral screening digital breast
tomosynthesis was performed. The images were evaluated with
computer-aided detection.

[R MLO synth-2D]
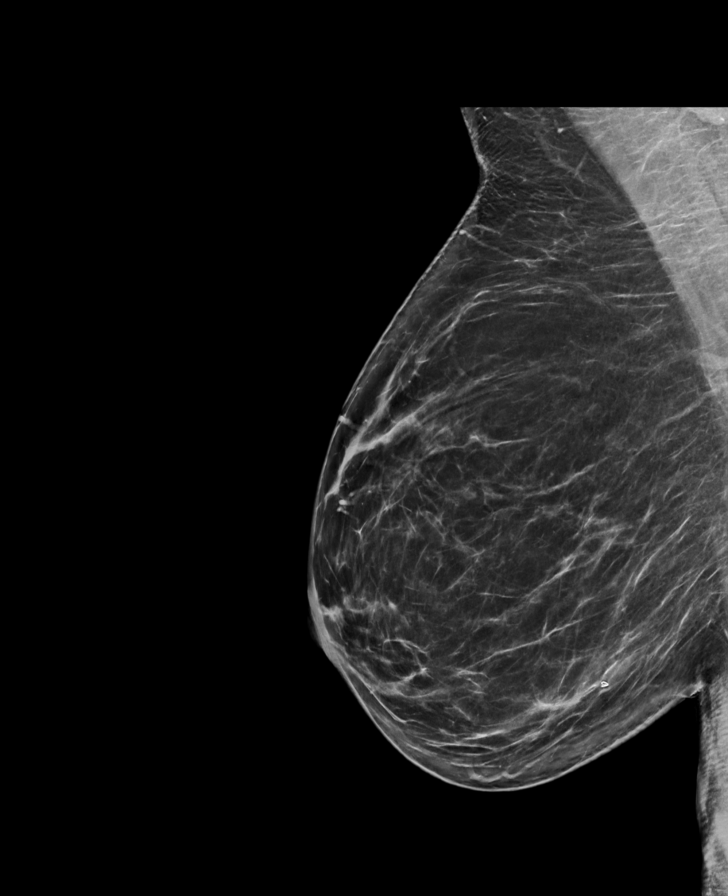

[L CC synth-2D]
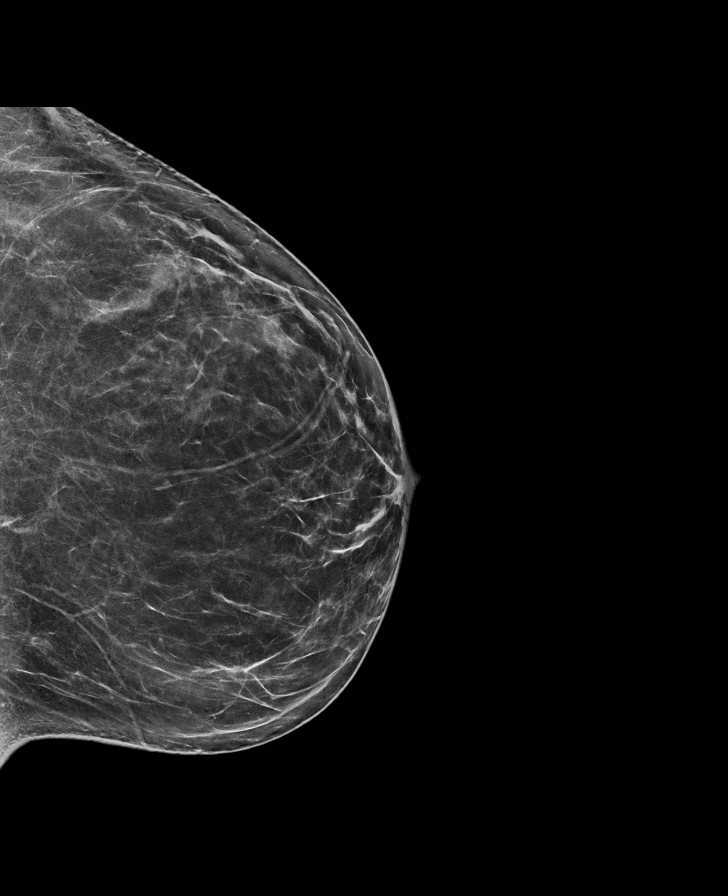

[R CC synth-2D]
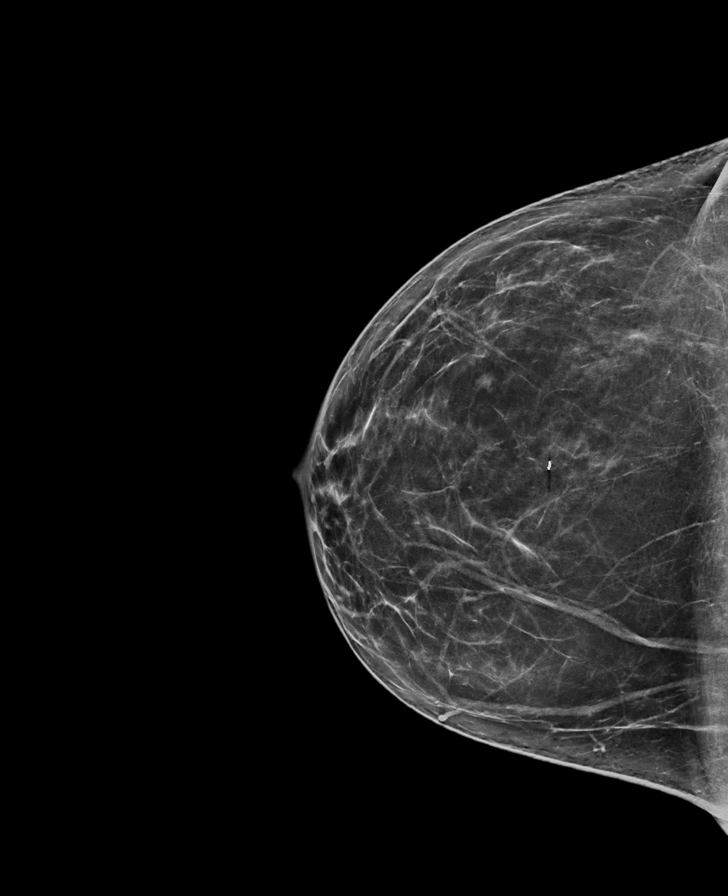

[L MLO synth-2D]
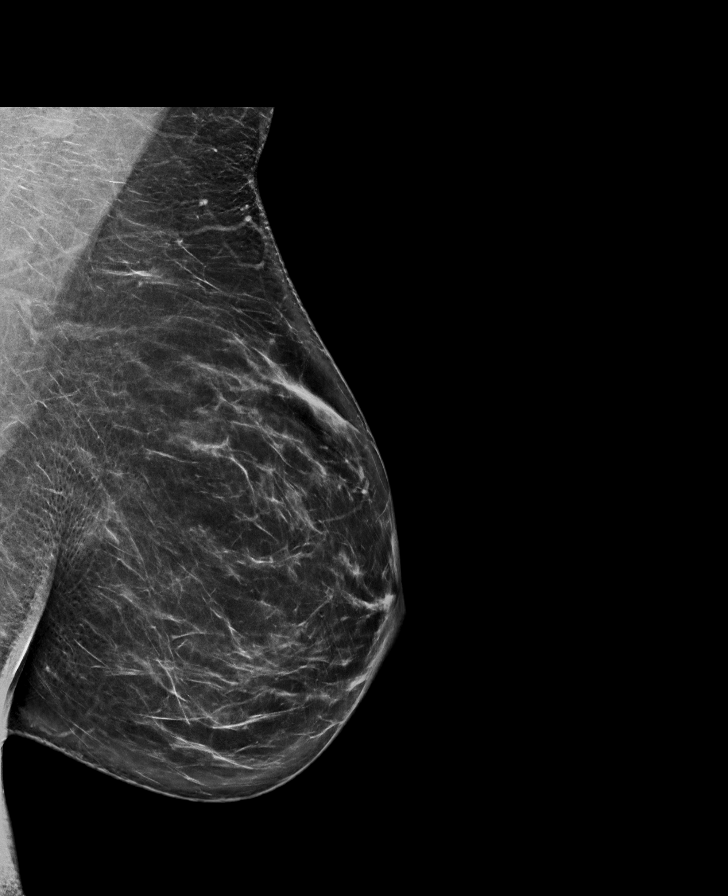

[R CC tomo · tomo slice 38/75.0]
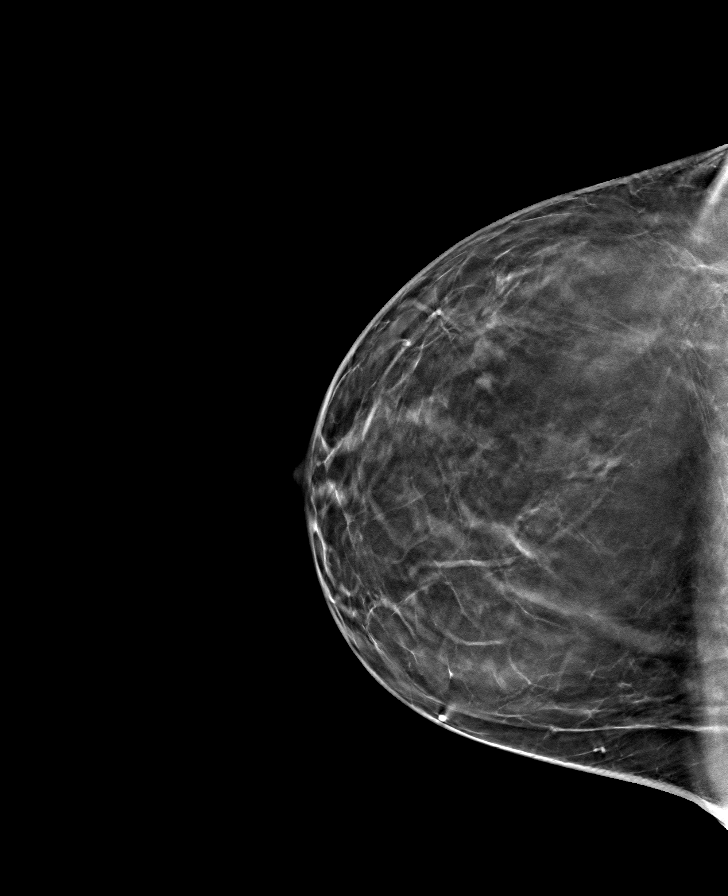

[L MLO tomo · tomo slice 43/86.0]
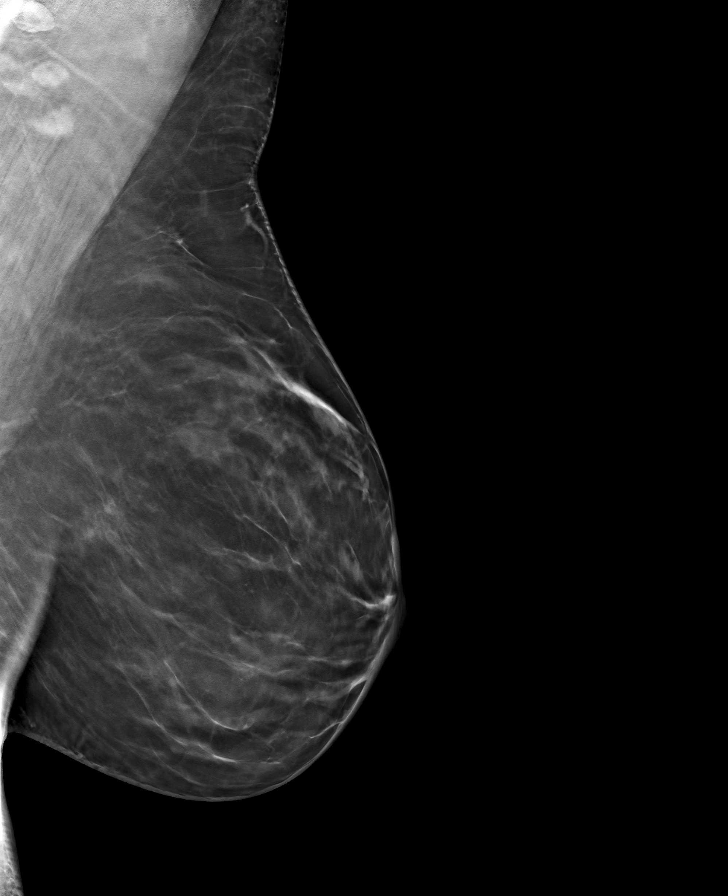

[L CC tomo · tomo slice 37/74.0]
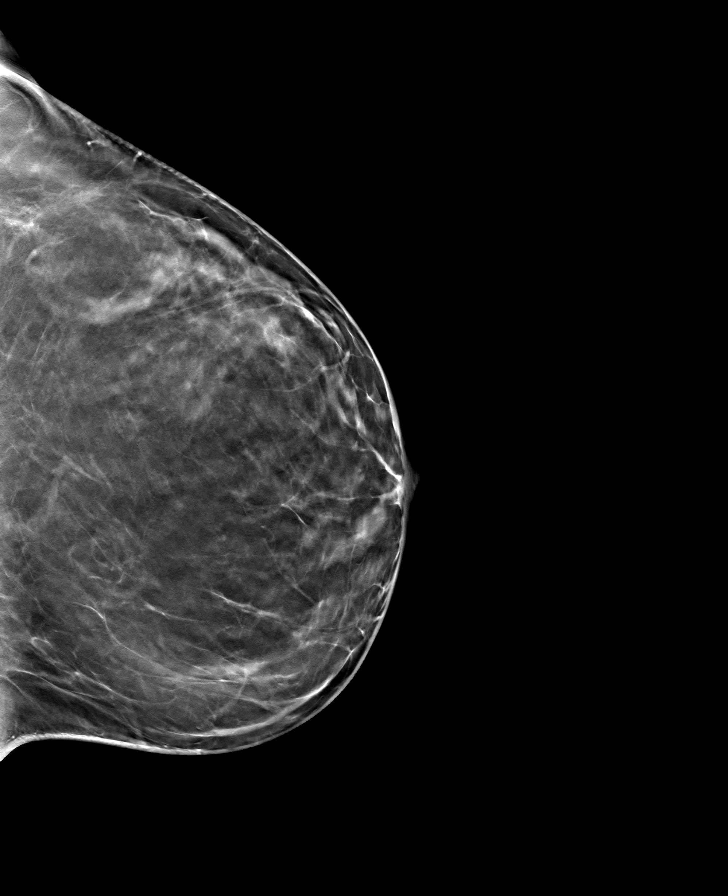

[R MLO tomo · tomo slice 40/79.0]
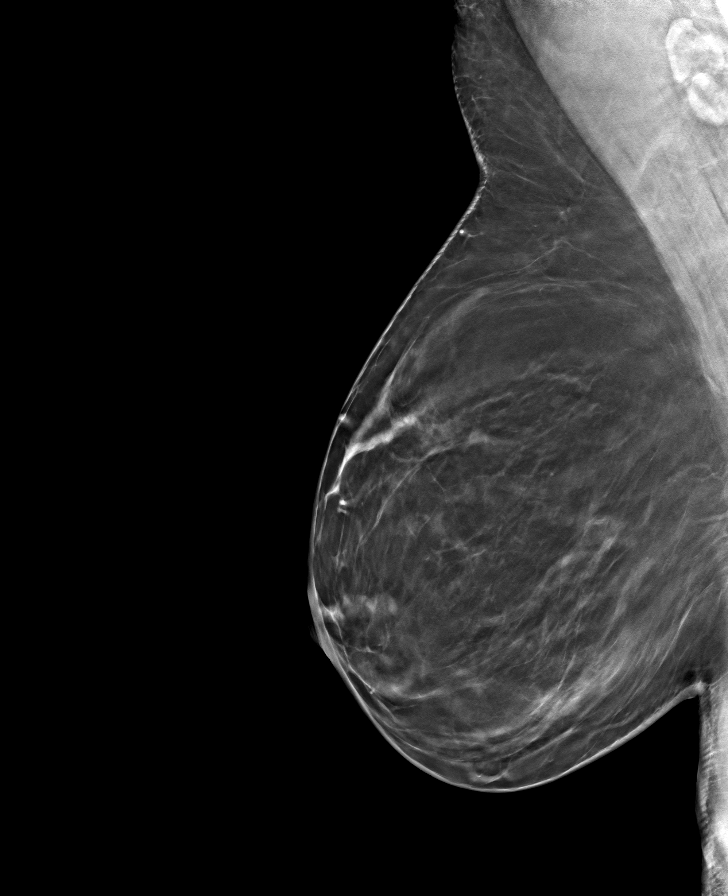

[8 of 24 positions shown; findings below may reference images not displayed]

ACR Breast Density Category b: There are scattered areas of
fibroglandular density.
FINDINGS: There are no findings suspicious for malignancy.
IMPRESSION: No mammographic evidence of malignancy. A result letter of this
screening mammogram will be mailed directly to the patient.

RECOMMENDATION:
Screening mammogram in one year. (Code:51-O-LD2)

BI-RADS CATEGORY  1: Negative.

## 2022-02-07 NOTE — Telephone Encounter (Signed)
TC to f/u after yesterday's visit to [facility]. No answer; left VM to call [phone] for problems or questions. 

## 2022-03-26 ENCOUNTER — Ambulatory Visit: Payer: Federal, State, Local not specified - PPO

## 2022-05-02 ENCOUNTER — Ambulatory Visit (INDEPENDENT_AMBULATORY_CARE_PROVIDER_SITE_OTHER): Payer: 59

## 2022-05-02 DIAGNOSIS — Z1231 Encounter for screening mammogram for malignant neoplasm of breast: Secondary | ICD-10-CM

## 2022-05-08 ENCOUNTER — Encounter: Payer: Self-pay | Admitting: Obstetrics and Gynecology

## 2022-05-08 ENCOUNTER — Ambulatory Visit (INDEPENDENT_AMBULATORY_CARE_PROVIDER_SITE_OTHER): Payer: 59 | Admitting: Obstetrics and Gynecology

## 2022-05-08 ENCOUNTER — Other Ambulatory Visit (HOSPITAL_COMMUNITY)
Admission: RE | Admit: 2022-05-08 | Discharge: 2022-05-08 | Disposition: A | Discharge status: Home / Self Care | Payer: 59 | Source: Ambulatory Visit | Attending: Obstetrics and Gynecology | Admitting: Obstetrics and Gynecology

## 2022-05-08 VITALS — BP 131/88 | HR 83 | Ht 68.0 in | Wt 189.0 lb

## 2022-05-08 DIAGNOSIS — Z01419 Encounter for gynecological examination (general) (routine) without abnormal findings: Secondary | ICD-10-CM

## 2022-05-08 NOTE — Progress Notes (Signed)
GYNECOLOGY ANNUAL PREVENTATIVE CARE ENCOUNTER NOTE  History:     [name] is a 47 y.o. G34P1001 female here for a routine annual gynecologic exam.  Current complaints: None.   Denies abnormal vaginal bleeding, discharge, pelvic pain, problems with intercourse or other gynecologic concerns.    Gynecologic History Patient's last menstrual period was 04/18/2022. Contraception: none Last Pap: 2019. Result was normal with negative HPV Last Mammogram: 05/03/2022.  Result was normal Last Colonoscopy: NA, discussed and reviewed. Reports insurance will not pay for this until age 17.  Obstetric History OB History  Gravida Para Term Preterm AB Living  '1 1 1 '$ 0 0 1  SAB IAB Ectopic Multiple Live Births  0 0 0 0 1    # Outcome Date GA Lbr Len/2nd Weight Sex Delivery Anes PTL Lv  1 Term 09/14/06    F CS-Unspec   LIV    History reviewed. No pertinent past medical history.  Past Surgical History:  Procedure Laterality Date   ANKLE SURGERY     BREAST BIOPSY Right 05/27/2017   CESAREAN SECTION      Current Outpatient Medications on File Prior to Visit  Medication Sig Dispense Refill   Multiple Vitamin (MULTIVITAMIN) tablet Take 1 tablet by mouth daily.     No current facility-administered medications on file prior to visit.    No Known Allergies  Social History:  reports that she has never smoked. She has never used smokeless tobacco. She reports that she does not drink alcohol and does not use drugs.  Family History  Problem Relation Age of Onset   Breast cancer Maternal Grandmother     The following portions of the patient's history were reviewed and updated as appropriate: allergies, current medications, past family history, past medical history, past social history, past surgical history and problem list.  Review of Systems Pertinent items noted in HPI and remainder of comprehensive ROS otherwise negative.  Physical Exam:  BP 131/88   Pulse 83   Ht '5\' 8"'$  (1.727 m)    Wt 189 lb (85.7 kg)   LMP 04/18/2022   BMI 28.74 kg/m  CONSTITUTIONAL: Well-developed, well-nourished female in no acute distress.  HENT:  Normocephalic, atraumatic, External right and left ear normal.  EYES: Conjunctivae and EOM are normal. Pupils are equal, round, and reactive to light. No scleral icterus.  NECK: Normal range of motion, supple, no masses.  Normal thyroid.  SKIN: Skin is warm and dry. No rash noted. Not diaphoretic. No erythema. No pallor. MUSCULOSKELETAL: Normal range of motion. No tenderness.  No cyanosis, clubbing, or edema. NEUROLOGIC: Alert and oriented to person, place, and time. Normal reflexes, muscle tone coordination.  PSYCHIATRIC: Normal mood and affect. Normal behavior. Normal judgment and thought content. CARDIOVASCULAR: Normal heart rate noted, regular rhythm RESPIRATORY: Clear to auscultation bilaterally. Effort and breath sounds normal, no problems with respiration noted. BREASTS: Symmetric in size. No masses, tenderness, skin changes, nipple drainage, or lymphadenopathy bilaterally. Performed in the presence of a chaperone. ABDOMEN: Soft, no distention noted.  No tenderness, rebound or guarding.  PELVIC: Normal appearing external genitalia and urethral meatus; normal appearing vaginal mucosa and cervix.  No abnormal vaginal discharge noted.  Pap smear obtained.  Normal uterine size, no other palpable masses, no uterine or adnexal tenderness.  Performed in the presence of a chaperone.   Assessment and Plan:   1. Well woman exam with routine gynecological exam  - Cytology - PAP( [facility])  - She declines labs.  Will follow up results of pap smear and manage accordingly. Mammogram scheduled Discussed need for colon cancer screening over the age 62. Colonoscopy recommended as this is the gold standard. Patient will decide and let us know her decision. Routine preventative health maintenance measures emphasized. Please refer to After Visit Summary for  other counseling recommendations.    Azlan Hanway, Artist Pais, [place] for [employer], [place]

## 2022-05-09 LAB — CYTOLOGY - PAP
Comment: NEGATIVE
Diagnosis: NEGATIVE
High risk HPV: NEGATIVE

## 2023-02-01 ENCOUNTER — Other Ambulatory Visit (INDEPENDENT_AMBULATORY_CARE_PROVIDER_SITE_OTHER): Payer: Self-pay | Admitting: General Surgery

## 2023-02-01 DIAGNOSIS — Z09 Encounter for follow-up examination after completed treatment for conditions other than malignant neoplasm: Secondary | ICD-10-CM

## 2023-02-01 MED ORDER — VALACYCLOVIR HCL 1 G PO TABS: 1000.0000 mg | ORAL_TABLET | Freq: Three times a day (TID) | ORAL | 2 refills | Status: AC

## 2023-02-01 NOTE — Progress Notes (Signed)
Fever blisters

## 2023-06-19 ENCOUNTER — Other Ambulatory Visit: Payer: Self-pay | Admitting: Obstetrics and Gynecology

## 2023-06-19 DIAGNOSIS — Z1231 Encounter for screening mammogram for malignant neoplasm of breast: Secondary | ICD-10-CM

## 2023-07-10 ENCOUNTER — Encounter (HOSPITAL_COMMUNITY): Payer: Self-pay

## 2023-07-17 ENCOUNTER — Ambulatory Visit

## 2023-07-31 ENCOUNTER — Ambulatory Visit (INDEPENDENT_AMBULATORY_CARE_PROVIDER_SITE_OTHER)

## 2023-07-31 DIAGNOSIS — Z1231 Encounter for screening mammogram for malignant neoplasm of breast: Secondary | ICD-10-CM

## 2023-08-05 ENCOUNTER — Ambulatory Visit: Payer: Self-pay | Admitting: Family Medicine
